# Patient Record
Sex: Female | Born: 1960
Health system: Southern US, Community
[De-identification: ages and names within clinical notes are randomized; demographics above are authoritative.]

## PROBLEM LIST (undated history)

## (undated) DIAGNOSIS — E079 Disorder of thyroid, unspecified: Secondary | ICD-10-CM

## (undated) DIAGNOSIS — J45909 Unspecified asthma, uncomplicated: Secondary | ICD-10-CM

## (undated) DIAGNOSIS — I10 Essential (primary) hypertension: Secondary | ICD-10-CM

## (undated) HISTORY — PX: ABDOMINAL HYSTERECTOMY: SHX81

## (undated) HISTORY — PX: THYROID SURGERY: SHX805

---

## 2011-06-26 DIAGNOSIS — E672 Megavitamin-B6 syndrome: Secondary | ICD-10-CM | POA: Insufficient documentation

## 2011-08-25 DIAGNOSIS — J309 Allergic rhinitis, unspecified: Secondary | ICD-10-CM | POA: Insufficient documentation

## 2013-06-29 ENCOUNTER — Encounter: Payer: Self-pay | Admitting: Emergency Medicine

## 2013-06-29 ENCOUNTER — Emergency Department
Admission: EM | Admit: 2013-06-29 | Discharge: 2013-06-29 | Disposition: A | Discharge status: Home / Self Care | Payer: 59 | Source: Home / Self Care | Attending: Family Medicine | Admitting: Family Medicine

## 2013-06-29 DIAGNOSIS — I951 Orthostatic hypotension: Secondary | ICD-10-CM

## 2013-06-29 DIAGNOSIS — J209 Acute bronchitis, unspecified: Secondary | ICD-10-CM

## 2013-06-29 DIAGNOSIS — J069 Acute upper respiratory infection, unspecified: Secondary | ICD-10-CM

## 2013-06-29 DIAGNOSIS — J45901 Unspecified asthma with (acute) exacerbation: Secondary | ICD-10-CM

## 2013-06-29 HISTORY — DX: Disorder of thyroid, unspecified: E07.9

## 2013-06-29 HISTORY — DX: Essential (primary) hypertension: I10

## 2013-06-29 HISTORY — DX: Unspecified asthma, uncomplicated: J45.909

## 2013-06-29 MED ORDER — AZITHROMYCIN 250 MG PO TABS: ORAL_TABLET | ORAL | Status: DC

## 2013-06-29 MED ORDER — METHYLPREDNISOLONE SODIUM SUCC 125 MG IJ SOLR
125.0000 mg | Freq: Once | INTRAMUSCULAR | Status: AC
  Administered 2013-06-29: 125 mg via INTRAMUSCULAR

## 2013-06-29 MED ORDER — PREDNISONE 50 MG PO TABS: ORAL_TABLET | ORAL | Status: DC

## 2013-06-29 MED ORDER — SODIUM CHLORIDE 0.9 % IV SOLN
Freq: Once | INTRAVENOUS | Status: AC
  Administered 2013-06-29: 1000 mL via INTRAVENOUS

## 2013-06-29 MED ORDER — BENZONATATE 100 MG PO CAPS: 100.0000 mg | ORAL_CAPSULE | Freq: Three times a day (TID) | ORAL | Status: DC | PRN

## 2013-06-29 NOTE — ED Notes (Signed)
Patient c/o body aches, sore throat, congestion , dry cough and chest tightness x 5 days. Patient has tried OTC Sudafed and Mucinex.

## 2013-06-29 NOTE — ED Provider Notes (Signed)
CSN: [id number]     Arrival date & time 06/29/13  1119 History   First MD Initiated Contact with Patient 06/29/13 1138     Chief Complaint  Patient presents with  . Cough  . Chest Pain    HPI URI Symptoms Onset: 4-5 days  Description: rhinorrhea, nasal congestion, cough, wheezing, chest tightness  Modifying factors:  Baseline hx/o asthma. Increased albuterol use.   Symptoms Nasal discharge: yes Fever: no Sore throat: no Cough: yes Wheezing: yes Ear pain: no GI symptoms: no Sick contacts: yes  Red Flags  Stiff neck: no Dyspnea: no Rash: no Swallowing difficulty: no  Sinusitis Risk Factors Headache/face pain: no Double sickening: no tooth pain: no  Allergy Risk Factors Sneezing: no Itchy scratchy throat: no Seasonal symptoms: no  Flu Risk Factors Headache: no muscle aches: no severe fatigue: no   No past medical history on file. No past surgical history on file. No family history on file. History  Substance Use Topics  . Smoking status: Not on file  . Smokeless tobacco: Not on file  . Alcohol Use: Not on file   OB History   No data available     Review of Systems  All other systems reviewed and are negative.    Allergies  Penicillins  Home Medications   Current Outpatient Rx  Name  Route  Sig  Dispense  Refill  . azithromycin (ZITHROMAX) 250 MG tablet      Take 2 tabs PO x 1 dose, then 1 tab PO QD x 4 days   6 tablet   0   . benzonatate (TESSALON) 100 MG capsule   Oral   Take 1-2 capsules (100-200 mg total) by mouth 3 (three) times daily as needed for cough.   40 capsule   0   . predniSONE (DELTASONE) 50 MG tablet      1 tab daily x 5 days   5 tablet   0    BP 114/78  Pulse 75  Temp(Src) 98.4 F (36.9 C) (Oral)  Resp 20  Ht 5\' 4"  (1.626 m)  Wt 135 lb 8 oz (61.462 kg)  BMI 23.25 kg/m2  SpO2 97% Physical Exam  Constitutional: She appears well-developed and well-nourished.  HENT:  Head: Normocephalic and atraumatic.   Eyes: Conjunctivae are normal. Pupils are equal, round, and reactive to light.  Neck: Normal range of motion. Neck supple.  Cardiovascular: Normal rate and regular rhythm.   Pulmonary/Chest: Effort normal. She has wheezes.  Abdominal: Soft. Bowel sounds are normal.  Musculoskeletal: Normal range of motion.  Neurological: She is alert.  Skin: Skin is warm.    ED Course  Procedures (including critical care time) Labs Review Labs Reviewed - No data to display Imaging Review No results found.  EKG Interpretation    Date/Time:    Ventricular Rate:    PR Interval:    QRS Duration:   QT Interval:    QTC Calculation:   R Axis:     Text Interpretation:              MDM   1. URI (upper respiratory infection)   2. Acute bronchitis   3. Asthma with acute exacerbation    URI induced asthma exacerbation.  Solumedrol 125mg  IM x1 Wil place on course of prednisone and zpak for resp coverage.  Tessalone perles for cough Discussed infectious and resp red flags at length.  Go to ER if sxs worsens.   Clinical update.  Pt reported nausea, weakness,  dizziness s/p solumedrol  Pt feels that it may from pain of shot. A lot of pain in L buttock VS obtained orthostatic BPs with SBPs in the low 100s-90s. Pt on HCTZ for BP.  EKG also obtained; NSR Pt given 1L NS bolus.  Symptomatically resolved s/p NS bolus. SBPs normalized to 110s. Likely vasovagal episode.  Pt left UC under her own power with no weakness, SOB.   Discussed CV, systemic and infectious red flags.      The patient and/or caregiver has been counseled thoroughly with regard to treatment plan and/or medications prescribed including dosage, schedule, interactions, rationale for use, and possible side effects and they verbalize understanding. Diagnoses and expected course of recovery discussed and will return if not improved as expected or if the condition worsens. Patient and/or caregiver verbalized understanding.             Shanda Howells, MD 06/29/13 1321

## 2014-02-16 ENCOUNTER — Emergency Department
Admission: EM | Admit: 2014-02-16 | Discharge: 2014-02-16 | Disposition: A | Discharge status: Home / Self Care | Payer: 59 | Source: Home / Self Care

## 2014-02-16 ENCOUNTER — Encounter: Payer: Self-pay | Admitting: Emergency Medicine

## 2014-02-16 ENCOUNTER — Emergency Department (INDEPENDENT_AMBULATORY_CARE_PROVIDER_SITE_OTHER): Payer: 59

## 2014-02-16 DIAGNOSIS — M25461 Effusion, right knee: Secondary | ICD-10-CM

## 2014-02-16 DIAGNOSIS — M25469 Effusion, unspecified knee: Secondary | ICD-10-CM

## 2014-02-16 MED ORDER — IBUPROFEN 800 MG PO TABS: 800.0000 mg | ORAL_TABLET | Freq: Three times a day (TID) | ORAL | Status: DC

## 2014-02-16 MED ORDER — HYDROCODONE-ACETAMINOPHEN 5-325 MG PO TABS: 2.0000 | ORAL_TABLET | ORAL | Status: DC | PRN

## 2014-02-16 NOTE — ED Notes (Signed)
Reports falling down steps 2 days ago; right knee abraded, stiff and edematous as well as painful.

## 2014-02-16 NOTE — Discharge Instructions (Signed)

## 2014-02-16 NOTE — ED Provider Notes (Signed)
CSN: [id number]     Arrival date & time 02/16/14  1945 History   None    No chief complaint on file.  (Consider location/radiation/quality/duration/timing/severity/associated sxs/prior Treatment) Patient is a 53 y.o. female presenting with knee pain. The history is provided by the patient. No language interpreter was used.  Knee Pain Location:  Knee Time since incident:  2 days Injury: yes   Knee location:  R knee Pain details:    Quality:  Aching   Radiates to:  Does not radiate   Severity:  Moderate   Onset quality:  Gradual   Duration:  2 days   Timing:  Constant   Progression:  Worsening Chronicity:  New Foreign body present:  No foreign bodies Tetanus status:  Up to date Prior injury to area:  No Relieved by:  Nothing Worsened by:  Nothing tried Ineffective treatments:  None tried Pt fell down steps and hit right knee.  Pt has soreness to knee and ankle  Past Medical History  Diagnosis Date  . Hypertension   . Thyroid disease   . Asthma    Past Surgical History  Procedure Laterality Date  . Cesarean section    . Abdominal hysterectomy    . Thyroid surgery     Family History  Problem Relation Age of Onset  . Hypertension Other   . Diabetes Other   . Lung cancer Other   . ALS Other    History  Substance Use Topics  . Smoking status: Former Research scientist (life sciences)  . Smokeless tobacco: Never Used  . Alcohol Use: No   OB History   Grav Para Term Preterm Abortions TAB SAB Ect Mult Living                 Review of Systems  All other systems reviewed and are negative.   Allergies  Penicillins  Home Medications   Prior to Admission medications   Medication Sig Start Date End Date Taking? Authorizing Provider  albuterol (PROVENTIL HFA;VENTOLIN HFA) 108 (90 BASE) MCG/ACT inhaler Inhale into the lungs every 6 (six) hours as needed for wheezing or shortness of breath.    Historical Provider, MD  azithromycin (ZITHROMAX) 250 MG tablet Take 2 tabs PO x 1 dose, then 1 tab  PO QD x 4 days 06/29/13   Shanda Howells, MD  benzonatate (TESSALON) 100 MG capsule Take 1-2 capsules (100-200 mg total) by mouth 3 (three) times daily as needed for cough. 06/29/13   Shanda Howells, MD  budesonide-formoterol [facility]) 160-4.5 MCG/ACT inhaler Inhale 2 puffs into the lungs 2 (two) times daily.    Historical Provider, MD  diclofenac (VOLTAREN) 75 MG EC tablet Take 75 mg by mouth 2 (two) times daily.    Historical Provider, MD  fexofenadine (ALLEGRA) 180 MG tablet Take 180 mg by mouth daily.    Historical Provider, MD  hydrochlorothiazide (HYDRODIURIL) 25 MG tablet Take 25 mg by mouth daily.    Historical Provider, MD  levothyroxine (SYNTHROID, LEVOTHROID) 125 MCG tablet Take 125 mcg by mouth daily before breakfast.    Historical Provider, MD  montelukast (SINGULAIR) 10 MG tablet Take 10 mg by mouth at bedtime.    Historical Provider, MD  predniSONE (DELTASONE) 50 MG tablet 1 tab daily x 5 days 06/29/13   Shanda Howells, MD   There were no vitals taken for this visit. Physical Exam  Vitals reviewed. Constitutional: She is oriented to person, place, and time. She appears well-developed and well-nourished.  HENT:  Head: Normocephalic and atraumatic.  Musculoskeletal: She exhibits tenderness.  Abrasion right knee,  Small bruise left knee, Right ankle abrasion  Neurological: She is alert and oriented to person, place, and time. She has normal reflexes.  Skin: There is erythema.  Psychiatric: She has a normal mood and affect.    ED Course  Procedures (including critical care time) Labs Review Labs Reviewed - No data to display  Imaging Review No results found.   MDM   1. Knee effusion, right   knee imbolizer Ibuprofen Hydrocodone Follow up with Dr. Darene Lamer for recheck in 1 week    [place], [place] 02/16/14 2042

## 2014-02-19 NOTE — ED Provider Notes (Signed)
Medical history/examination/treatment/procedure(s) were performed by non-physician provider and as supervising physician I was immediately available for consultation/collaboration.   Jacqulyn Cane, MD 02/19/14 1140

## 2014-02-24 ENCOUNTER — Ambulatory Visit (INDEPENDENT_AMBULATORY_CARE_PROVIDER_SITE_OTHER): Payer: 59 | Admitting: Sports Medicine

## 2014-02-24 ENCOUNTER — Encounter: Payer: Self-pay | Admitting: Sports Medicine

## 2014-02-24 VITALS — BP 144/82 | HR 62 | Ht 64.0 in | Wt 140.0 lb

## 2014-02-24 DIAGNOSIS — M1711 Unilateral primary osteoarthritis, right knee: Secondary | ICD-10-CM | POA: Insufficient documentation

## 2014-02-24 DIAGNOSIS — M1710 Unilateral primary osteoarthritis, unspecified knee: Secondary | ICD-10-CM

## 2014-02-24 NOTE — Progress Notes (Signed)
Patient ID: Kristina Kelley, female   DOB: July 07, 1960, 53 y.o.   MRN: [id number]   Subjective:    I'm seeing this patient as a consultation for: Jacqulyn Cane, MD  CC: Right knee pain  HPI: Kristina Kelley is a very pleasant 53 year old female with history of bilateral knee osteoarthritis who presents after a fall down two stairs 11 days ago (9/11). Reports that she fell straight onto her knees, right more than left, without twisting of the knee. She was seen in our Urgent Care on 9/14 after the knee became increasingly painful and swollen. X-ray imaging revealed mild degenerative changes with a moderate suprapatellar knee joint effusion without evidence of fracture or dislocation. She was discharged from our UC with a knee immobilizer, ibuprofen,and hydrocodone. Pain has been better controlled with these measures, and swelling has decreased significantly. She does report receiving both steroid injections and visco supplementation in the knee several years ago, with moderate relief after injections.  Past medical history, Surgical history, Family history not pertinant except as noted below, Social history, Allergies, and medications have been entered into the medical record, reviewed, and no changes needed.   Review of Systems: No body aches, joint swelling, muscle aches, chest pain or shortness of breath.  Objective:   General: Well developed, well nourished, and in no acute distress.  Neuro/Psych: Alert and oriented x3, extra-ocular muscles intact, able to move all 4 extremities, sensation grossly intact. Skin: Warm and dry, no rashes noted. Abrasion over right knee, left knee and right ankle. Slight bruising present on right knee. Respiratory: Not using accessory muscles, speaking in full sentences, trachea midline.  Cardiovascular: Pulses palpable, no extremity edema. Abdomen: Does not appear distended.  Right Knee: Inspection with no erythema or obvious bony abnormalities. Mild effusion  present. Palpation normal with no warmth or condyle tenderness. Tenderness to palpation over the medial joint line and at the areas of bruising at the tip of the patella. ROM full in extension but limited by pain in flexion. Non painful patellar compression. Patellar glide with crepitus. Patellar and quadriceps tendons unremarkable. Hamstring and quadriceps strength is normal.   Procedure: Real-time Ultrasound Guided Injection of right knee Device: GE Logiq E  Verbal informed consent obtained.  Time-out conducted.  Noted no overlying erythema, induration, or other signs of local infection.  Skin prepped in a sterile fashion.  Local anesthesia: Topical Ethyl chloride.  With sterile technique and under real time ultrasound guidance: 2 cc kenalog 40, 4 cc lidocaine injected easily into the suprapatellar recess.   Completed without difficulty  Pain immediately resolved suggesting accurate placement of the medication.  Advised to call if fevers/chills, erythema, induration, drainage, or persistent bleeding.  Images permanently stored and available for review in the ultrasound unit.  Impression: Technically successful ultrasound guided injection.  Impression and Recommendations:   This case required medical decision making of moderate complexity.  Right Knee Pain: This pain likely represents an exacerbation of her long-standing osteoarthritis after her recent fall. - Ultrasound-guided injection done in the office today - Continue Ibuprofen and Hydrocodone as needed for pain - Discontinue knee immobilizer - Return for follow-up in one month

## 2014-02-24 NOTE — Assessment & Plan Note (Signed)
With a recent flare after a fall. Injection as above. Discontinue knee immobilizer. Return in one month.

## 2014-03-26 ENCOUNTER — Encounter: Payer: Self-pay | Admitting: Sports Medicine

## 2014-03-26 ENCOUNTER — Ambulatory Visit (INDEPENDENT_AMBULATORY_CARE_PROVIDER_SITE_OTHER): Payer: 59 | Admitting: Sports Medicine

## 2014-03-26 VITALS — BP 126/80 | HR 62 | Ht 64.5 in | Wt 140.0 lb

## 2014-03-26 DIAGNOSIS — M1711 Unilateral primary osteoarthritis, right knee: Secondary | ICD-10-CM

## 2014-03-26 NOTE — Progress Notes (Signed)
  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 We injected her knee at the last visit and she is pain-free at rest, but still has a significant amount of pain when going down the stairs. Moderate, persistent. Localized under the kneecap without radiation.  Past medical history, Surgical history, Family history not pertinant except as noted below, Social history, Allergies, and medications have been entered into the medical record, reviewed, and no changes needed.   Review of Systems: No fevers, chills, night sweats, weight loss, chest pain, or shortness of breath.   Objective:    General: Well Developed, well nourished, and in no acute distress.  Neuro: Alert and oriented x3, extra-ocular muscles intact, sensation grossly intact.  HEENT: Normocephalic, atraumatic, pupils equal round reactive to light, neck supple, no masses, no lymphadenopathy, thyroid nonpalpable.  Skin: Warm and dry, no rashes. Cardiac: Regular rate and rhythm, no murmurs rubs or gallops, no lower extremity edema.  Respiratory: Clear to auscultation bilaterally. Not using accessory muscles, speaking in full sentences. Right Knee: Normal to inspection with no erythema or effusion or obvious bony abnormalities. Tender to palpation under the medial and lateral patellar facet with crepitus. ROM normal in flexion and extension and lower leg rotation. Ligaments with solid consistent endpoints including ACL, PCL, LCL, MCL. Negative Mcmurray's and provocative meniscal tests. Patellar and quadriceps tendons unremarkable. Hamstring and quadriceps strength is normal.  Impression and Recommendations:

## 2014-03-26 NOTE — Assessment & Plan Note (Signed)
Patellofemoral osteoarthritis. Improve significantly after steroid injection, pain-free at rest, but 7/10 going downstairs. Formal physical therapy, we will get Orthovisc approved and start her series in the right knee. If fails this she would be a candidate for mesenchymal stem cell implantation.

## 2014-03-31 ENCOUNTER — Telehealth: Payer: Self-pay | Admitting: *Deleted

## 2014-03-31 NOTE — Telephone Encounter (Signed)
LMOM for [place] to call re: UMR coverage for orthovisc. Benefits Investigation detailed that she has been uninsured with UMR since 09/02/13 and Shanon Brow is the only one covered. Awaiting phone call. Margette Fast, CMA

## 2014-03-31 NOTE — Telephone Encounter (Signed)
Dr. Darene Lamer - Just an [place]

## 2014-04-01 NOTE — Telephone Encounter (Signed)
Marti returned call and insurance was updated in computer. Margette Fast, RMA

## 2014-04-06 ENCOUNTER — Telehealth: Payer: Self-pay | Admitting: *Deleted

## 2014-04-06 ENCOUNTER — Ambulatory Visit (INDEPENDENT_AMBULATORY_CARE_PROVIDER_SITE_OTHER): Payer: 59 | Admitting: Physical Therapy

## 2014-04-06 DIAGNOSIS — M25569 Pain in unspecified knee: Secondary | ICD-10-CM

## 2014-04-06 DIAGNOSIS — M1711 Unilateral primary osteoarthritis, right knee: Secondary | ICD-10-CM

## 2014-04-06 DIAGNOSIS — M6281 Muscle weakness (generalized): Secondary | ICD-10-CM

## 2014-04-06 NOTE — Telephone Encounter (Signed)
Msg left on voicemail to return call scheduling Orthovisc injection. Waiting on return call. Margette Fast, RMA

## 2014-04-08 NOTE — Telephone Encounter (Signed)
Called mobile and left voice mail to call and schedule orthovisc now that it was approved. Waiting on return call. Margette Fast, CMA

## 2014-04-16 ENCOUNTER — Encounter (INDEPENDENT_AMBULATORY_CARE_PROVIDER_SITE_OTHER): Payer: 59 | Admitting: Physical Therapy

## 2014-04-16 DIAGNOSIS — M5416 Radiculopathy, lumbar region: Secondary | ICD-10-CM

## 2014-04-16 DIAGNOSIS — M2550 Pain in unspecified joint: Secondary | ICD-10-CM

## 2014-04-16 DIAGNOSIS — R5381 Other malaise: Secondary | ICD-10-CM

## 2014-04-16 DIAGNOSIS — R262 Difficulty in walking, not elsewhere classified: Secondary | ICD-10-CM

## 2014-04-23 ENCOUNTER — Encounter (INDEPENDENT_AMBULATORY_CARE_PROVIDER_SITE_OTHER): Payer: 59 | Admitting: Physical Therapy

## 2014-04-23 DIAGNOSIS — M1711 Unilateral primary osteoarthritis, right knee: Secondary | ICD-10-CM

## 2014-04-23 DIAGNOSIS — M25569 Pain in unspecified knee: Secondary | ICD-10-CM

## 2014-04-23 DIAGNOSIS — M6281 Muscle weakness (generalized): Secondary | ICD-10-CM

## 2014-04-27 ENCOUNTER — Encounter: Payer: Self-pay | Admitting: Sports Medicine

## 2014-04-27 ENCOUNTER — Ambulatory Visit (INDEPENDENT_AMBULATORY_CARE_PROVIDER_SITE_OTHER): Payer: 59 | Admitting: Sports Medicine

## 2014-04-27 VITALS — BP 123/70 | HR 62 | Wt 137.0 lb

## 2014-04-27 DIAGNOSIS — M1711 Unilateral primary osteoarthritis, right knee: Secondary | ICD-10-CM

## 2014-04-27 NOTE — Assessment & Plan Note (Signed)
Steroid and Orthovisc injection #1 today. Return in one week for #2 of 4

## 2014-04-27 NOTE — Progress Notes (Signed)
  Procedure: Real-time Ultrasound Guided Injection of right knee Device: GE Logiq E  Verbal informed consent obtained.  Time-out conducted.  Noted no overlying erythema, induration, or other signs of local infection.  Skin prepped in a sterile fashion.  Local anesthesia: Topical Ethyl chloride.  With sterile technique and under real time ultrasound guidance:  2 mL kenalog 40, 4 mL lidocaine injected easily, syringe switched and 30 mg/2 mL of OrthoVisc (sodium hyaluronate) in a prefilled syringe was injected easily into the knee through a 22-gauge needle. Completed without difficulty  Pain immediately resolved suggesting accurate placement of the medication.  Advised to call if fevers/chills, erythema, induration, drainage, or persistent bleeding.  Images permanently stored and available for review in the ultrasound unit.  Impression: Technically successful ultrasound guided injection.

## 2014-04-28 ENCOUNTER — Encounter: Payer: 59 | Admitting: Physical Therapy

## 2014-05-06 ENCOUNTER — Encounter: Payer: 59 | Admitting: Physical Therapy

## 2014-05-06 ENCOUNTER — Ambulatory Visit (INDEPENDENT_AMBULATORY_CARE_PROVIDER_SITE_OTHER): Payer: 59 | Admitting: Sports Medicine

## 2014-05-06 ENCOUNTER — Encounter: Payer: Self-pay | Admitting: Sports Medicine

## 2014-05-06 VITALS — BP 120/73 | HR 85 | Ht 64.0 in | Wt 140.0 lb

## 2014-05-06 DIAGNOSIS — M1711 Unilateral primary osteoarthritis, right knee: Secondary | ICD-10-CM

## 2014-05-06 NOTE — Assessment & Plan Note (Signed)
Orthovisc injection #2 of 4 into right knee. Return in 1 week for #3.

## 2014-05-06 NOTE — Progress Notes (Addendum)
   Procedure: Real-time Ultrasound Guided Injection of right knee Device: GE Logiq E  Ultrasound guided injection is preferred based studies that show increased duration, increased effect, greater accuracy, decreased procedural pain, increased response rate, and decreased cost with ultrasound guided versus blind injection.  Verbal informed consent obtained.  Time-out conducted.  Noted no overlying erythema, induration, or other signs of local infection.  Skin prepped in a sterile fashion.  Local anesthesia: Topical Ethyl chloride.  With sterile technique and under real time ultrasound guidance:  Aspirated 8 cc straw colored fluid, syringe switched and 30 mg/2 mL of OrthoVisc (sodium hyaluronate) in a prefilled syringe was injected easily into the knee through a 22-gauge needle. Completed without difficulty  Pain immediately resolved suggesting accurate placement of the medication.  Advised to call if fevers/chills, erythema, induration, drainage, or persistent bleeding.  Images permanently stored and available for review in the ultrasound unit.  Impression: Technically successful ultrasound guided injection.

## 2014-05-13 ENCOUNTER — Ambulatory Visit (INDEPENDENT_AMBULATORY_CARE_PROVIDER_SITE_OTHER): Payer: 59 | Admitting: Sports Medicine

## 2014-05-13 ENCOUNTER — Encounter: Payer: Self-pay | Admitting: Sports Medicine

## 2014-05-13 VITALS — BP 98/65 | HR 64 | Ht 64.5 in

## 2014-05-13 DIAGNOSIS — M1711 Unilateral primary osteoarthritis, right knee: Secondary | ICD-10-CM

## 2014-05-13 NOTE — Assessment & Plan Note (Signed)
Orthovisc injection #3 of 4 into the right knee. Return in one week for #4.

## 2014-05-13 NOTE — Progress Notes (Signed)
   Procedure: Real-time Ultrasound Guided Injection of right knee Device: GE Logiq E  Ultrasound guided injection is preferred based studies that show increased duration, increased effect, greater accuracy, decreased procedural pain, increased response rate, and decreased cost with ultrasound guided versus blind injection.  Verbal informed consent obtained.  Time-out conducted.  Noted no overlying erythema, induration, or other signs of local infection.  Skin prepped in a sterile fashion.  Local anesthesia: Topical Ethyl chloride.  With sterile technique and under real time ultrasound guidance:  Aspirated 4 cc straw colored fluid, syringe switched and 30 mg/2 mL of OrthoVisc (sodium hyaluronate) in a prefilled syringe was injected easily into the knee through a 22-gauge needle. Completed without difficulty  Pain immediately resolved suggesting accurate placement of the medication.  Advised to call if fevers/chills, erythema, induration, drainage, or persistent bleeding.  Images permanently stored and available for review in the ultrasound unit.  Impression: Technically successful ultrasound guided injection.

## 2014-05-21 ENCOUNTER — Encounter: Payer: Self-pay | Admitting: Sports Medicine

## 2014-05-21 ENCOUNTER — Ambulatory Visit (INDEPENDENT_AMBULATORY_CARE_PROVIDER_SITE_OTHER): Payer: 59 | Admitting: Sports Medicine

## 2014-05-21 DIAGNOSIS — M1711 Unilateral primary osteoarthritis, right knee: Secondary | ICD-10-CM

## 2014-05-21 NOTE — Progress Notes (Signed)
   Procedure: Real-time Ultrasound Guided Injection of right knee Device: GE Logiq E  Ultrasound guided injection is preferred based studies that show increased duration, increased effect, greater accuracy, decreased procedural pain, increased response rate, and decreased cost with ultrasound guided versus blind injection.  Verbal informed consent obtained.  Time-out conducted.  Noted no overlying erythema, induration, or other signs of local infection.  Skin prepped in a sterile fashion.  Local anesthesia: Topical Ethyl chloride.  With sterile technique and under real time ultrasound guidance:  30 mg/2 mL of OrthoVisc (sodium hyaluronate) in a prefilled syringe was injected easily into the knee through a 22-gauge needle. Completed without difficulty  Pain immediately resolved suggesting accurate placement of the medication.  Advised to call if fevers/chills, erythema, induration, drainage, or persistent bleeding.  Images permanently stored and available for review in the ultrasound unit.  Impression: Technically successful ultrasound guided injection.

## 2014-05-21 NOTE — Assessment & Plan Note (Signed)
Orthovisc injection #4 of 4 given today into the right knee. 50% improved. Switching from Voltaren to meloxicam which she already has at home. Return as needed.

## 2015-04-23 ENCOUNTER — Other Ambulatory Visit: Payer: Self-pay | Admitting: Family Medicine

## 2015-04-23 DIAGNOSIS — Z139 Encounter for screening, unspecified: Secondary | ICD-10-CM

## 2015-05-06 ENCOUNTER — Ambulatory Visit (INDEPENDENT_AMBULATORY_CARE_PROVIDER_SITE_OTHER): Payer: 59

## 2015-05-06 DIAGNOSIS — Z139 Encounter for screening, unspecified: Secondary | ICD-10-CM

## 2015-05-06 DIAGNOSIS — Z1231 Encounter for screening mammogram for malignant neoplasm of breast: Secondary | ICD-10-CM

## 2015-07-19 MED FILL — SYMBICORT 160-4.5 MCG INH: 160-4.5 | 30 days supply | Qty: 10 | Fill #3

## 2015-07-19 MED FILL — ESTRADIOL 0.1 MG PATCH: 0.1 | 84 days supply | Qty: 24 | Fill #2

## 2015-07-28 MED FILL — LEVOTHYROXINE 125 MCG TAB: 125 | 90 days supply | Qty: 90 | Fill #0

## 2015-07-28 MED FILL — HYDROCHLOROTHIAZIDE 25 MG T: 25 | 90 days supply | Qty: 90 | Fill #0

## 2015-08-03 DIAGNOSIS — M79675 Pain in left toe(s): Secondary | ICD-10-CM | POA: Diagnosis not present

## 2015-08-03 DIAGNOSIS — E039 Hypothyroidism, unspecified: Secondary | ICD-10-CM | POA: Insufficient documentation

## 2015-08-03 DIAGNOSIS — C4491 Basal cell carcinoma of skin, unspecified: Secondary | ICD-10-CM | POA: Insufficient documentation

## 2015-08-03 MED FILL — MELOXICAM 15 MG TABLET: 15 | 30 days supply | Qty: 30 | Fill #0

## 2015-09-15 MED FILL — SYMBICORT 160-4.5 MCG INH: 160-4.5 | 30 days supply | Qty: 10 | Fill #4

## 2015-10-08 MED FILL — ESTRADIOL 0.1 MG PATCH: 0.1 | 84 days supply | Qty: 24 | Fill #3

## 2015-10-12 ENCOUNTER — Ambulatory Visit: Payer: 59 | Admitting: Podiatry

## 2015-10-19 ENCOUNTER — Ambulatory Visit (INDEPENDENT_AMBULATORY_CARE_PROVIDER_SITE_OTHER): Payer: 59 | Admitting: Podiatry

## 2015-10-19 ENCOUNTER — Ambulatory Visit (INDEPENDENT_AMBULATORY_CARE_PROVIDER_SITE_OTHER): Payer: 59

## 2015-10-19 ENCOUNTER — Telehealth: Payer: Self-pay | Admitting: *Deleted

## 2015-10-19 ENCOUNTER — Other Ambulatory Visit: Payer: Self-pay | Admitting: Podiatry

## 2015-10-19 ENCOUNTER — Encounter: Payer: Self-pay | Admitting: Podiatry

## 2015-10-19 VITALS — BP 141/90 | HR 62 | Resp 12

## 2015-10-19 DIAGNOSIS — M79672 Pain in left foot: Secondary | ICD-10-CM | POA: Diagnosis not present

## 2015-10-19 DIAGNOSIS — M76812 Anterior tibial syndrome, left leg: Secondary | ICD-10-CM | POA: Diagnosis not present

## 2015-10-19 NOTE — Telephone Encounter (Addendum)
MRI orders to [facility] [employer]. 10/22/2015-[facility] DOES NOT REQUIRE PRE-CERT FOR [id number], REFERENCE # [id number] KATR. Faxed to [facility].  10/28/2015-DrMilinda Pointer requested Over Read of MRI left foot.  Mailed to [employer]. Informed pt of the delay.  11/04/2015-Left message informing pt Dr. Milinda Pointer had reviewed MRI overread and request pt make an appt to discuss.

## 2015-10-19 NOTE — Progress Notes (Signed)
   Subjective:    Patient ID: Kristina Kelley, female    DOB: 1961/02/01, 55 y.o.   MRN: [id number]  HPI: She presents today with a chief complaint of a large knot to the dorsal medial aspect of her left foot that has been very painful over the past few weeks to months. She states that the knot is been there for years and generally swells but today looks much better. She states it hurts right ear she points right behind the nodule and that she rubs the tibialis anterior tendon. She states that she gets some pain that radiates up her leg but very rarely out the toe. She's tried no over-the-counter treatment and has tried nothing to help alleviate symptoms.    Review of Systems  HENT: Positive for sinus pressure.   Cardiovascular: Positive for leg swelling.  Musculoskeletal: Positive for myalgias.       Objective:   Physical Exam: Vital signs are stable she is alert and oriented 3 she presents very pleasant distress. Pulses are strongly palpable bilateral. Neurologic sensorium is intact per Semmes-Weinstein monofilament deep tendon reflexes are intact muscle strength +5 over 5 dorsiflexion plantar flexors and inverters everters she does have pain on dorsiflexion and inversion against resistance from the tibialis anterior of the left foot. There is tenderness on palpation of this tendon. She has a very large nonpulsatile mass to the dorsal aspect of the first metatarsal medial cuneiform joint. Radiographs confirm what appears to be joint space narrowing in this area on 2 views. I see no signs of her fracture. The contralateral foot does also demonstrate a very small nonpulsatile nodular mass in the same area. 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 Cutaneous evaluation demonstrates supple well-hydrated cutis no erythema edema saline as drainage or odor. No open lesions or wounds.        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 Large osseous nonpulsatile nodule most likely associated with genetic predisposition but possibly osteoarthritic changes left foot.  Plan: Secondary to pain of the insertion site of the tibialis anterior an MRI is necessary to assess for a tear of the tendon and possible surgical intervention.

## 2015-10-25 ENCOUNTER — Telehealth: Payer: Self-pay | Admitting: *Deleted

## 2015-10-25 NOTE — Telephone Encounter (Signed)
"  I'm calling about your on-line precertification request for code [phone].  Upon checking her plan pre-cert is not required for MRI.  So there's no need to start a case.  Reference is my name and today's date, LethaM.  For other questions, call us."

## 2015-10-27 ENCOUNTER — Ambulatory Visit
Admission: RE | Admit: 2015-10-27 | Discharge: 2015-10-27 | Disposition: A | Discharge status: Home / Self Care | Payer: 59 | Source: Ambulatory Visit | Attending: Podiatry | Admitting: Podiatry

## 2015-10-27 DIAGNOSIS — M79672 Pain in left foot: Secondary | ICD-10-CM | POA: Diagnosis not present

## 2015-10-27 DIAGNOSIS — M76812 Anterior tibial syndrome, left leg: Secondary | ICD-10-CM

## 2015-10-28 NOTE — Telephone Encounter (Signed)
-----   Message from Garrel Ridgel, [place] sent at 10/27/2015  4:36 PM EDT ----- I need another over read please and inform patient of the delay and please inform SERORS of the area in question.  Thanks.

## 2015-11-15 MED FILL — SYMBICORT 160-4.5 MCG INH: 160-4.5 | 30 days supply | Qty: 10 | Fill #5

## 2015-11-16 ENCOUNTER — Ambulatory Visit (INDEPENDENT_AMBULATORY_CARE_PROVIDER_SITE_OTHER): Payer: 59 | Admitting: Podiatry

## 2015-11-16 ENCOUNTER — Encounter: Payer: Self-pay | Admitting: Podiatry

## 2015-11-16 VITALS — BP 115/78 | HR 62 | Resp 16

## 2015-11-16 DIAGNOSIS — M76812 Anterior tibial syndrome, left leg: Secondary | ICD-10-CM | POA: Diagnosis not present

## 2015-11-16 NOTE — Progress Notes (Signed)
She presents today for follow-up of her tibialis anterior tendon pain. And her MRI read as comeback.  Objective: She states that she is still in pain. She states that he has not been as painful lately because of the shoe gear. Vital signs are stable alert and oriented 3 she still has pain on palpation of the distal most aspect tibialis anterior as it courses beneath the medial aspect of the first metatarsal medial cuneiform joint. MRI does not demonstrates any type of osseus or tendinous abnormalities in this area.  Assessment: Tibialis anterior tendinitis.  Plan: I injected her today with dexamethasone and local anesthetic a total of 2 mg was injected just beneath the insertion site of the tibialis anterior tendon. I also referred her to physical therapy.

## 2015-11-17 MED FILL — HYDROCHLOROTHIAZIDE 25 MG T: 25 | 90 days supply | Qty: 90 | Fill #0

## 2015-11-17 MED FILL — LEVOTHYROXINE 125 MCG TAB: 125 | 90 days supply | Qty: 90 | Fill #0

## 2015-11-23 DIAGNOSIS — M6281 Muscle weakness (generalized): Secondary | ICD-10-CM | POA: Diagnosis not present

## 2015-11-23 DIAGNOSIS — M25572 Pain in left ankle and joints of left foot: Secondary | ICD-10-CM | POA: Diagnosis not present

## 2015-11-23 DIAGNOSIS — R262 Difficulty in walking, not elsewhere classified: Secondary | ICD-10-CM | POA: Diagnosis not present

## 2015-12-01 DIAGNOSIS — M6281 Muscle weakness (generalized): Secondary | ICD-10-CM | POA: Diagnosis not present

## 2015-12-01 DIAGNOSIS — R262 Difficulty in walking, not elsewhere classified: Secondary | ICD-10-CM | POA: Diagnosis not present

## 2015-12-01 DIAGNOSIS — M25572 Pain in left ankle and joints of left foot: Secondary | ICD-10-CM | POA: Diagnosis not present

## 2015-12-06 ENCOUNTER — Encounter: Payer: Self-pay | Admitting: Podiatry

## 2015-12-15 DIAGNOSIS — M25572 Pain in left ankle and joints of left foot: Secondary | ICD-10-CM | POA: Diagnosis not present

## 2015-12-15 DIAGNOSIS — R262 Difficulty in walking, not elsewhere classified: Secondary | ICD-10-CM | POA: Diagnosis not present

## 2015-12-15 DIAGNOSIS — M6281 Muscle weakness (generalized): Secondary | ICD-10-CM | POA: Diagnosis not present

## 2015-12-21 DIAGNOSIS — M6281 Muscle weakness (generalized): Secondary | ICD-10-CM | POA: Diagnosis not present

## 2015-12-21 DIAGNOSIS — R2689 Other abnormalities of gait and mobility: Secondary | ICD-10-CM | POA: Diagnosis not present

## 2015-12-21 DIAGNOSIS — M25572 Pain in left ankle and joints of left foot: Secondary | ICD-10-CM | POA: Diagnosis not present

## 2015-12-22 DIAGNOSIS — H16042 Marginal corneal ulcer, left eye: Secondary | ICD-10-CM | POA: Diagnosis not present

## 2015-12-22 MED FILL — GATIFLOXACIN 0.5% EYE DROPS: 0.5 | 10 days supply | Qty: 3 | Fill #0

## 2015-12-23 MED FILL — ESTRADIOL 0.1 MG PATCH: 0.1 | 84 days supply | Qty: 24 | Fill #4

## 2015-12-23 MED FILL — SYMBICORT 160-4.5 MCG INH: 160-4.5 | 30 days supply | Qty: 10 | Fill #6

## 2015-12-29 DIAGNOSIS — R2689 Other abnormalities of gait and mobility: Secondary | ICD-10-CM | POA: Diagnosis not present

## 2015-12-29 DIAGNOSIS — M25572 Pain in left ankle and joints of left foot: Secondary | ICD-10-CM | POA: Diagnosis not present

## 2015-12-29 DIAGNOSIS — M6281 Muscle weakness (generalized): Secondary | ICD-10-CM | POA: Diagnosis not present

## 2015-12-29 MED FILL — MONTELUKAST SOD 10 MG TAB: 10 | 30 days supply | Qty: 30 | Fill #0

## 2015-12-29 MED FILL — VENTOLIN HFA 90 MCG INHALER: 108 (90 BAS | 30 days supply | Qty: 18 | Fill #0

## 2016-01-26 DIAGNOSIS — M25562 Pain in left knee: Secondary | ICD-10-CM | POA: Diagnosis not present

## 2016-01-26 DIAGNOSIS — M6281 Muscle weakness (generalized): Secondary | ICD-10-CM | POA: Diagnosis not present

## 2016-01-26 DIAGNOSIS — R2689 Other abnormalities of gait and mobility: Secondary | ICD-10-CM | POA: Diagnosis not present

## 2016-01-26 DIAGNOSIS — M25561 Pain in right knee: Secondary | ICD-10-CM | POA: Diagnosis not present

## 2016-01-26 DIAGNOSIS — M25572 Pain in left ankle and joints of left foot: Secondary | ICD-10-CM | POA: Diagnosis not present

## 2016-01-26 DIAGNOSIS — R262 Difficulty in walking, not elsewhere classified: Secondary | ICD-10-CM | POA: Diagnosis not present

## 2016-02-01 MED FILL — LEVOTHYROXINE 125 MCG TAB: 125 | 90 days supply | Qty: 90 | Fill #0 | Status: TO

## 2016-02-01 MED FILL — HYDROCHLOROTHIAZIDE 25 MG T: 25 | 90 days supply | Qty: 90 | Fill #0 | Status: TO

## 2016-02-02 DIAGNOSIS — R262 Difficulty in walking, not elsewhere classified: Secondary | ICD-10-CM | POA: Diagnosis not present

## 2016-02-02 DIAGNOSIS — M25572 Pain in left ankle and joints of left foot: Secondary | ICD-10-CM | POA: Diagnosis not present

## 2016-02-02 DIAGNOSIS — M6281 Muscle weakness (generalized): Secondary | ICD-10-CM | POA: Diagnosis not present

## 2016-03-23 MED FILL — ESTRADIOL 0.1 MG PATCH: 0.1 | 84 days supply | Qty: 24 | Fill #0

## 2016-03-23 MED FILL — SYMBICORT 160-4.5 MCG INH: 160-4.5 | 30 days supply | Qty: 10 | Fill #0

## 2016-03-24 DIAGNOSIS — Z87891 Personal history of nicotine dependence: Secondary | ICD-10-CM | POA: Diagnosis not present

## 2016-03-24 DIAGNOSIS — E039 Hypothyroidism, unspecified: Secondary | ICD-10-CM | POA: Diagnosis not present

## 2016-03-24 DIAGNOSIS — Z23 Encounter for immunization: Secondary | ICD-10-CM | POA: Diagnosis not present

## 2016-03-24 DIAGNOSIS — J4540 Moderate persistent asthma, uncomplicated: Secondary | ICD-10-CM | POA: Diagnosis not present

## 2016-03-24 DIAGNOSIS — J301 Allergic rhinitis due to pollen: Secondary | ICD-10-CM | POA: Diagnosis not present

## 2016-03-24 DIAGNOSIS — Z79899 Other long term (current) drug therapy: Secondary | ICD-10-CM | POA: Diagnosis not present

## 2016-03-24 DIAGNOSIS — Z7951 Long term (current) use of inhaled steroids: Secondary | ICD-10-CM | POA: Diagnosis not present

## 2016-03-24 DIAGNOSIS — I10 Essential (primary) hypertension: Secondary | ICD-10-CM | POA: Diagnosis not present

## 2016-03-24 MED FILL — FLUCONAZOLE 100 MG TABLET: 100 | 7 days supply | Qty: 7 | Fill #0

## 2016-03-31 DIAGNOSIS — Z01419 Encounter for gynecological examination (general) (routine) without abnormal findings: Secondary | ICD-10-CM | POA: Diagnosis not present

## 2016-04-10 DIAGNOSIS — H527 Unspecified disorder of refraction: Secondary | ICD-10-CM | POA: Diagnosis not present

## 2016-05-05 ENCOUNTER — Other Ambulatory Visit: Payer: Self-pay | Admitting: Obstetrics and Gynecology

## 2016-05-05 DIAGNOSIS — Z1231 Encounter for screening mammogram for malignant neoplasm of breast: Secondary | ICD-10-CM

## 2016-05-12 MED FILL — HYDROCHLOROTHIAZIDE 25 MG T: 25 | 90 days supply | Qty: 90 | Fill #0

## 2016-05-12 MED FILL — LEVOTHYROXINE 125 MCG TAB: 125 | 90 days supply | Qty: 90 | Fill #0

## 2016-05-16 ENCOUNTER — Ambulatory Visit (INDEPENDENT_AMBULATORY_CARE_PROVIDER_SITE_OTHER): Payer: 59

## 2016-05-16 DIAGNOSIS — Z1231 Encounter for screening mammogram for malignant neoplasm of breast: Secondary | ICD-10-CM | POA: Diagnosis not present

## 2016-05-22 MED FILL — SYMBICORT 160-4.5 MCG INH: 160-4.5 | 30 days supply | Qty: 10 | Fill #1

## 2016-05-23 MED FILL — ESTRADIOL 0.1 MG PATCH: 0.1 | 84 days supply | Qty: 24 | Fill #0

## 2016-07-25 MED FILL — SYMBICORT 160-4.5 MCG INH: 160-4.5 | 30 days supply | Qty: 10 | Fill #2

## 2016-08-09 MED FILL — HYDROCHLOROTHIAZIDE 25 MG T: 25 | 90 days supply | Qty: 90 | Fill #0

## 2016-08-09 MED FILL — LEVOTHYROXINE 125 MCG TAB: 125 | 90 days supply | Qty: 90 | Fill #0

## 2016-08-24 ENCOUNTER — Other Ambulatory Visit: Payer: Self-pay | Admitting: Family Medicine

## 2016-08-24 ENCOUNTER — Ambulatory Visit (INDEPENDENT_AMBULATORY_CARE_PROVIDER_SITE_OTHER): Payer: 59

## 2016-08-24 DIAGNOSIS — M79641 Pain in right hand: Secondary | ICD-10-CM | POA: Diagnosis not present

## 2016-08-24 DIAGNOSIS — M19042 Primary osteoarthritis, left hand: Secondary | ICD-10-CM

## 2016-08-24 DIAGNOSIS — Z0000 Encounter for general adult medical examination without abnormal findings: Secondary | ICD-10-CM | POA: Diagnosis not present

## 2016-08-24 DIAGNOSIS — M19041 Primary osteoarthritis, right hand: Secondary | ICD-10-CM | POA: Diagnosis not present

## 2016-08-24 DIAGNOSIS — M659 Synovitis and tenosynovitis, unspecified: Secondary | ICD-10-CM | POA: Diagnosis not present

## 2016-08-24 DIAGNOSIS — M7989 Other specified soft tissue disorders: Secondary | ICD-10-CM

## 2016-08-24 DIAGNOSIS — M79642 Pain in left hand: Secondary | ICD-10-CM | POA: Diagnosis not present

## 2016-08-24 MED FILL — ESTRADIOL 0.1 MG PATCH: 0.1 | 84 days supply | Qty: 24 | Fill #1

## 2016-08-31 MED FILL — DICLOFENAC SOD 75 MG TAB EC: 75 | 30 days supply | Qty: 60 | Fill #0

## 2016-09-08 DIAGNOSIS — J40 Bronchitis, not specified as acute or chronic: Secondary | ICD-10-CM | POA: Diagnosis not present

## 2016-09-08 DIAGNOSIS — J329 Chronic sinusitis, unspecified: Secondary | ICD-10-CM | POA: Diagnosis not present

## 2016-09-08 MED FILL — VIRTUSSIN AC LIQUID: 100-10 | 8 days supply | Qty: 118 | Fill #0

## 2016-09-08 MED FILL — CLARITHROMYCIN 500 MG TAB: 500 | 10 days supply | Qty: 20 | Fill #0

## 2016-09-08 MED FILL — FLUCONAZOLE 150 MG TABLET: 150 | 7 days supply | Qty: 2 | Fill #0

## 2016-09-11 MED FILL — MOXIFLOXACIN HCL 400 MG TAB: 400 | 7 days supply | Qty: 7 | Fill #0

## 2016-09-12 MED FILL — SYMBICORT 160-4.5 MCG INH: 160-4.5 | 30 days supply | Qty: 10 | Fill #3

## 2016-09-12 MED FILL — AZITHROMYCIN 250 MG TABLET: 250 | 5 days supply | Qty: 6 | Fill #0 | Status: TO

## 2016-11-08 MED FILL — DICLOFENAC SOD 75 MG TAB EC: 75 | 30 days supply | Qty: 60 | Fill #1

## 2016-11-09 MED FILL — LEVOTHYROXINE 125 MCG TAB: 125 | 30 days supply | Qty: 30 | Fill #0 | Status: TO

## 2016-11-09 MED FILL — HYDROCHLOROTHIAZIDE 25 MG T: 25 | 30 days supply | Qty: 30 | Fill #0 | Status: TO

## 2016-11-16 MED FILL — SYMBICORT 160-4.5 MCG INH: 160-4.5 | 30 days supply | Qty: 10 | Fill #4

## 2016-11-29 MED FILL — VIVELLE-DOT 0.1 MG PATCH: 0.1 | 28 days supply | Qty: 8 | Fill #2 | Status: TO

## 2017-01-08 MED FILL — SYMBICORT 160-4.5 MCG INH: 160-4.5 | 30 days supply | Qty: 10 | Fill #0 | Status: TO

## 2017-01-08 MED FILL — DICLOFENAC SODIUM 75 MG TAB: 75 | 30 days supply | Qty: 60 | Fill #2 | Status: TO

## 2017-04-16 DIAGNOSIS — H527 Unspecified disorder of refraction: Secondary | ICD-10-CM | POA: Diagnosis not present

## 2017-04-16 DIAGNOSIS — H25813 Combined forms of age-related cataract, bilateral: Secondary | ICD-10-CM | POA: Diagnosis not present

## 2017-05-07 ENCOUNTER — Other Ambulatory Visit: Payer: Self-pay | Admitting: Family Medicine

## 2017-05-07 DIAGNOSIS — Z1231 Encounter for screening mammogram for malignant neoplasm of breast: Secondary | ICD-10-CM

## 2017-05-17 ENCOUNTER — Other Ambulatory Visit: Payer: Self-pay | Admitting: *Deleted

## 2017-05-17 ENCOUNTER — Ambulatory Visit (INDEPENDENT_AMBULATORY_CARE_PROVIDER_SITE_OTHER): Payer: 59

## 2017-05-17 DIAGNOSIS — Z1231 Encounter for screening mammogram for malignant neoplasm of breast: Secondary | ICD-10-CM

## 2017-09-12 DIAGNOSIS — M5412 Radiculopathy, cervical region: Secondary | ICD-10-CM | POA: Insufficient documentation

## 2017-12-18 ENCOUNTER — Emergency Department (INDEPENDENT_AMBULATORY_CARE_PROVIDER_SITE_OTHER): Payer: 59

## 2017-12-18 ENCOUNTER — Other Ambulatory Visit: Payer: Self-pay

## 2017-12-18 ENCOUNTER — Encounter: Payer: Self-pay | Admitting: *Deleted

## 2017-12-18 ENCOUNTER — Emergency Department (INDEPENDENT_AMBULATORY_CARE_PROVIDER_SITE_OTHER)
Admission: EM | Admit: 2017-12-18 | Discharge: 2017-12-18 | Disposition: A | Discharge status: Home / Self Care | Payer: 59 | Source: Home / Self Care | Attending: Family Medicine | Admitting: Family Medicine

## 2017-12-18 DIAGNOSIS — S62639A Displaced fracture of distal phalanx of unspecified finger, initial encounter for closed fracture: Secondary | ICD-10-CM | POA: Diagnosis not present

## 2017-12-18 DIAGNOSIS — S62635A Displaced fracture of distal phalanx of left ring finger, initial encounter for closed fracture: Secondary | ICD-10-CM | POA: Diagnosis not present

## 2017-12-18 DIAGNOSIS — S60221A Contusion of right hand, initial encounter: Secondary | ICD-10-CM | POA: Diagnosis not present

## 2017-12-18 NOTE — Discharge Instructions (Addendum)
Wear ace wrap until swelling decreases.  Elevate hand.  Apply ice pack for 10 to 15 minutes, 3 to 4 times daily  Continue until pain and swelling decrease.   Buddy tape finger.  May take Tylenol at bedtime for pain.
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
## 2018-02-17 IMAGING — DX DG HAND COMPLETE 3+V*L*
3 series · 3 of 3 positions shown · non-contrast
Comparison: None.

CLINICAL DATA: Bilateral hand pain and swelling for the past 6
months. No known injury.

EXAM:
LEFT HAND - COMPLETE 3+ VIEW

[hand pa]
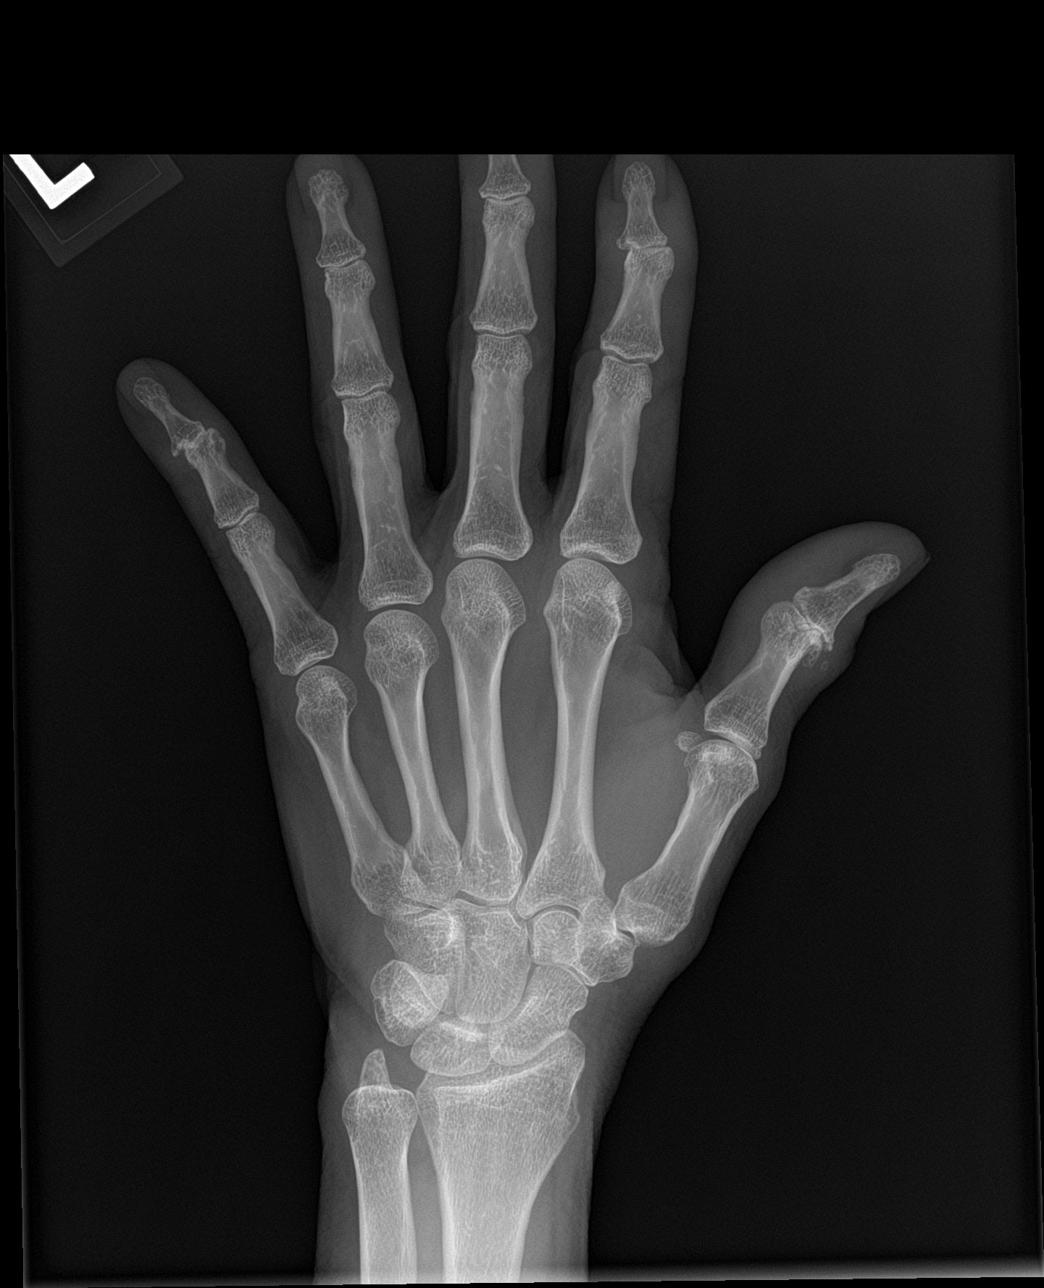

[hand obl]
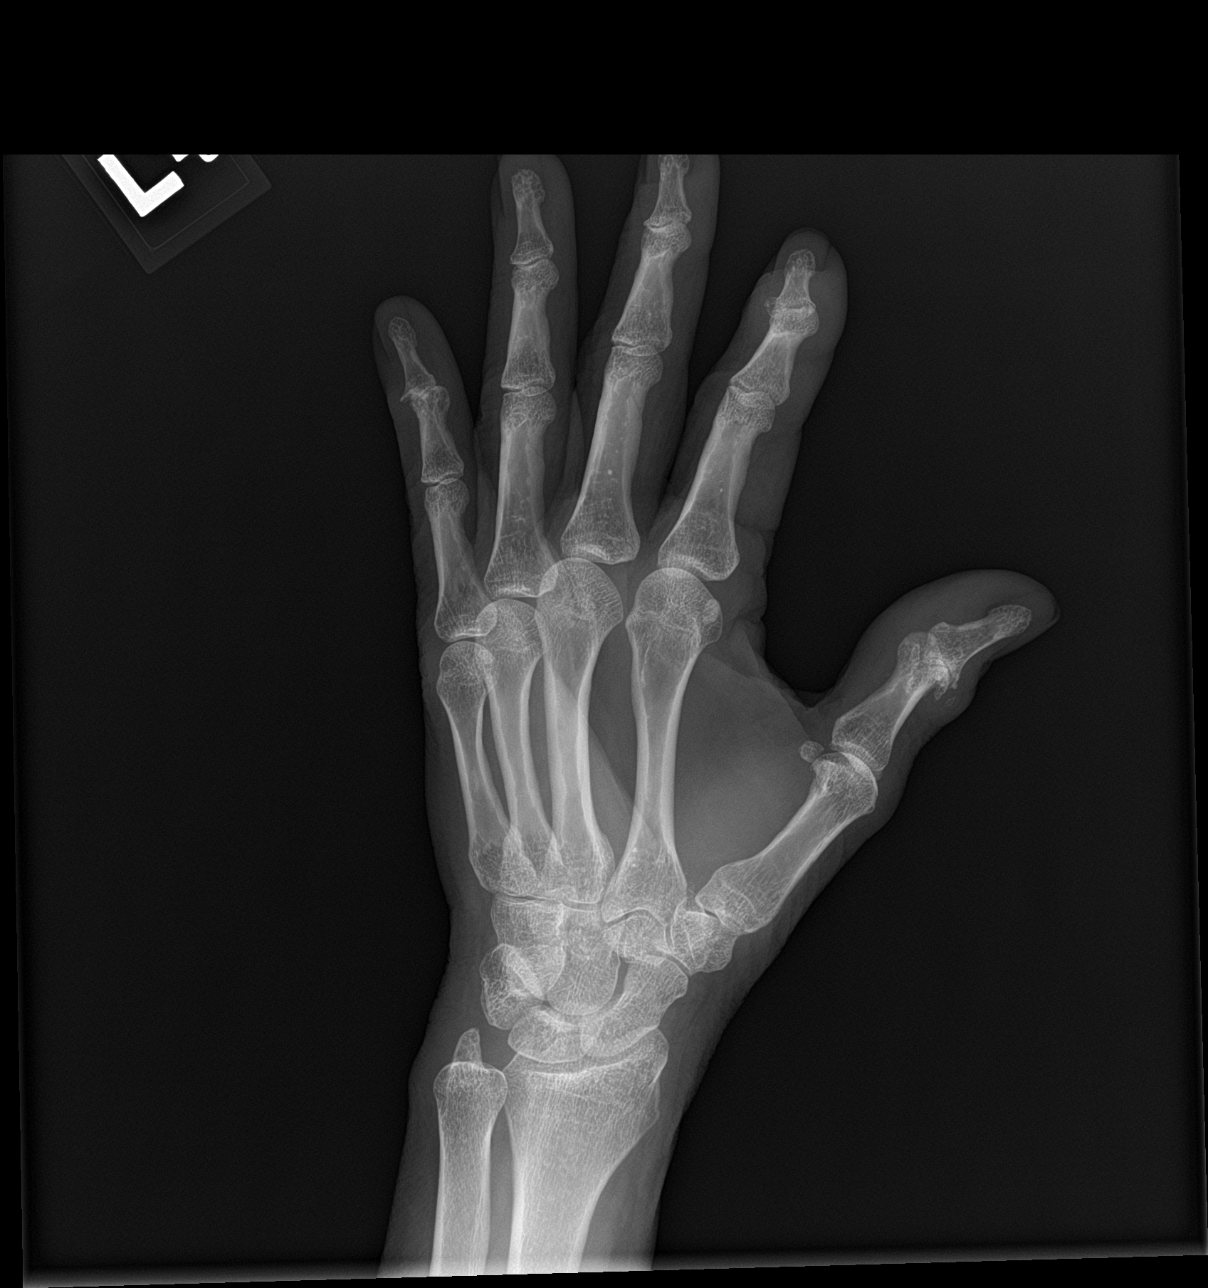

[hand lat]
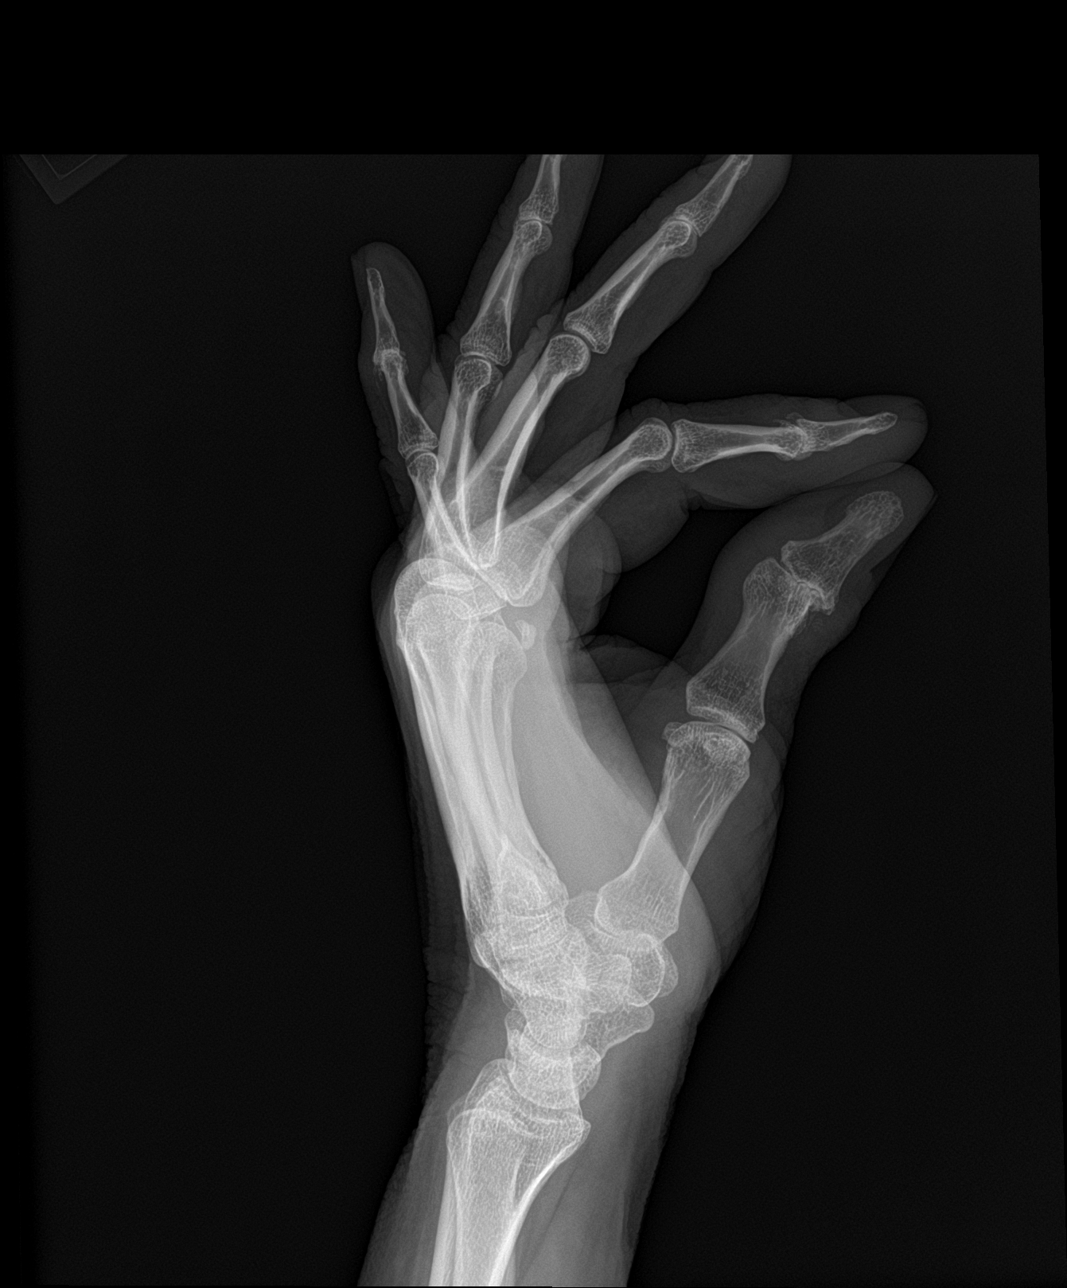

[3 of 3 positions shown; findings below may reference images not displayed]

FINDINGS: Marked first IP joint space narrowing and moderate spur formation
with a small amount of periarticular calcification and ossification.
Marked joint space narrowing and moderate spur formation involving
the second DIP joint. Similar changes involving the fifth DIP joint.
No bone erosions.
IMPRESSION: Changes of osteoarthritis, as described above.

## 2018-07-05 ENCOUNTER — Other Ambulatory Visit: Payer: Self-pay | Admitting: *Deleted

## 2018-07-05 DIAGNOSIS — Z0000 Encounter for general adult medical examination without abnormal findings: Secondary | ICD-10-CM

## 2018-07-11 ENCOUNTER — Ambulatory Visit (INDEPENDENT_AMBULATORY_CARE_PROVIDER_SITE_OTHER): Payer: 59

## 2018-07-11 DIAGNOSIS — Z0000 Encounter for general adult medical examination without abnormal findings: Secondary | ICD-10-CM

## 2018-07-11 DIAGNOSIS — Z1231 Encounter for screening mammogram for malignant neoplasm of breast: Secondary | ICD-10-CM
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
## 2019-07-14 ENCOUNTER — Other Ambulatory Visit: Payer: Self-pay | Admitting: *Deleted

## 2019-07-14 DIAGNOSIS — Z1231 Encounter for screening mammogram for malignant neoplasm of breast: Secondary | ICD-10-CM

## 2019-07-23 ENCOUNTER — Ambulatory Visit (INDEPENDENT_AMBULATORY_CARE_PROVIDER_SITE_OTHER): Payer: 59

## 2019-07-23 ENCOUNTER — Other Ambulatory Visit: Payer: Self-pay

## 2019-07-23 DIAGNOSIS — Z1231 Encounter for screening mammogram for malignant neoplasm of breast: Secondary | ICD-10-CM

## 2019-09-19 ENCOUNTER — Other Ambulatory Visit (HOSPITAL_BASED_OUTPATIENT_CLINIC_OR_DEPARTMENT_OTHER): Payer: Self-pay | Admitting: Family Medicine

## 2020-02-12 ENCOUNTER — Other Ambulatory Visit (HOSPITAL_BASED_OUTPATIENT_CLINIC_OR_DEPARTMENT_OTHER): Payer: Self-pay | Admitting: Pulmonary Disease

## 2020-02-12 DIAGNOSIS — J4540 Moderate persistent asthma, uncomplicated: Secondary | ICD-10-CM | POA: Diagnosis not present

## 2020-02-12 DIAGNOSIS — Z87891 Personal history of nicotine dependence: Secondary | ICD-10-CM | POA: Diagnosis not present

## 2020-02-12 DIAGNOSIS — Z7189 Other specified counseling: Secondary | ICD-10-CM | POA: Diagnosis not present

## 2020-02-12 DIAGNOSIS — J301 Allergic rhinitis due to pollen: Secondary | ICD-10-CM | POA: Diagnosis not present

## 2020-02-24 DIAGNOSIS — L578 Other skin changes due to chronic exposure to nonionizing radiation: Secondary | ICD-10-CM | POA: Diagnosis not present

## 2020-02-24 DIAGNOSIS — Z85820 Personal history of malignant melanoma of skin: Secondary | ICD-10-CM | POA: Diagnosis not present

## 2020-02-24 DIAGNOSIS — L821 Other seborrheic keratosis: Secondary | ICD-10-CM | POA: Diagnosis not present

## 2020-03-23 MED FILL — LEVOTHYROXINE SODIUM 112 MC: 112 | 60 days supply | Qty: 60 | Fill #0

## 2020-03-23 MED FILL — HYDROCHLOROTHIAZIDE 25 MG T: 25 | 60 days supply | Qty: 60 | Fill #0

## 2020-03-23 MED FILL — MONTELUKAST SOD 10 MG TAB: 10 | 90 days supply | Qty: 90 | Fill #0

## 2020-03-23 MED FILL — DICLOFENAC SODIUM 75 MG TAB: 75 | 30 days supply | Qty: 60 | Fill #0

## 2020-03-26 MED FILL — ESTRADIOL 0.1 MG PATCH: 0.1 | 56 days supply | Qty: 16 | Fill #0

## 2020-04-08 MED FILL — ESTRADIOL 0.1 MG PATCH: 0.1 | 56 days supply | Qty: 16 | Fill #0

## 2020-05-10 ENCOUNTER — Other Ambulatory Visit (HOSPITAL_BASED_OUTPATIENT_CLINIC_OR_DEPARTMENT_OTHER): Payer: Self-pay | Admitting: Family Medicine

## 2020-05-10 MED FILL — HYDROCHLOROTHIAZIDE 25 MG T: 25 | 60 days supply | Qty: 60 | Fill #0

## 2020-05-10 MED FILL — LEVOTHYROXINE SODIUM 112 MC: 112 | 60 days supply | Qty: 60 | Fill #0

## 2020-05-20 ENCOUNTER — Other Ambulatory Visit (HOSPITAL_BASED_OUTPATIENT_CLINIC_OR_DEPARTMENT_OTHER): Payer: Self-pay | Admitting: *Deleted

## 2020-05-24 MED FILL — ESTRADIOL 0.1 MG PATCH: 0.1 | 56 days supply | Qty: 16 | Fill #0

## 2020-06-08 DIAGNOSIS — D224 Melanocytic nevi of scalp and neck: Secondary | ICD-10-CM | POA: Diagnosis not present

## 2020-06-08 DIAGNOSIS — D1801 Hemangioma of skin and subcutaneous tissue: Secondary | ICD-10-CM | POA: Diagnosis not present

## 2020-06-08 DIAGNOSIS — Z85820 Personal history of malignant melanoma of skin: Secondary | ICD-10-CM | POA: Diagnosis not present

## 2020-06-08 DIAGNOSIS — C4359 Malignant melanoma of other part of trunk: Secondary | ICD-10-CM | POA: Insufficient documentation

## 2020-06-08 DIAGNOSIS — D2272 Melanocytic nevi of left lower limb, including hip: Secondary | ICD-10-CM | POA: Diagnosis not present

## 2020-06-08 DIAGNOSIS — L821 Other seborrheic keratosis: Secondary | ICD-10-CM | POA: Diagnosis not present

## 2020-07-13 ENCOUNTER — Other Ambulatory Visit: Payer: Self-pay | Admitting: *Deleted

## 2020-07-13 DIAGNOSIS — Z1231 Encounter for screening mammogram for malignant neoplasm of breast: Secondary | ICD-10-CM

## 2020-07-28 ENCOUNTER — Ambulatory Visit (INDEPENDENT_AMBULATORY_CARE_PROVIDER_SITE_OTHER): Payer: Managed Care, Other (non HMO)

## 2020-07-28 ENCOUNTER — Other Ambulatory Visit: Payer: Self-pay

## 2020-07-28 DIAGNOSIS — Z1231 Encounter for screening mammogram for malignant neoplasm of breast: Secondary | ICD-10-CM

## 2021-05-12 DIAGNOSIS — M1990 Unspecified osteoarthritis, unspecified site: Secondary | ICD-10-CM | POA: Diagnosis not present

## 2021-05-12 DIAGNOSIS — Z780 Asymptomatic menopausal state: Secondary | ICD-10-CM | POA: Diagnosis not present

## 2021-05-12 DIAGNOSIS — K5900 Constipation, unspecified: Secondary | ICD-10-CM | POA: Diagnosis not present

## 2021-05-12 DIAGNOSIS — R6889 Other general symptoms and signs: Secondary | ICD-10-CM | POA: Diagnosis not present

## 2021-05-12 DIAGNOSIS — I10 Essential (primary) hypertension: Secondary | ICD-10-CM | POA: Diagnosis not present

## 2021-05-12 DIAGNOSIS — Z1321 Encounter for screening for nutritional disorder: Secondary | ICD-10-CM | POA: Diagnosis not present

## 2021-05-12 DIAGNOSIS — R5383 Other fatigue: Secondary | ICD-10-CM | POA: Diagnosis not present

## 2021-05-12 DIAGNOSIS — E559 Vitamin D deficiency, unspecified: Secondary | ICD-10-CM | POA: Diagnosis not present

## 2021-05-12 DIAGNOSIS — E039 Hypothyroidism, unspecified: Secondary | ICD-10-CM | POA: Diagnosis not present

## 2021-06-29 ENCOUNTER — Other Ambulatory Visit: Payer: Self-pay | Admitting: *Deleted

## 2021-06-29 DIAGNOSIS — Z780 Asymptomatic menopausal state: Secondary | ICD-10-CM

## 2021-06-29 DIAGNOSIS — Z1231 Encounter for screening mammogram for malignant neoplasm of breast: Secondary | ICD-10-CM

## 2021-08-03 ENCOUNTER — Ambulatory Visit (INDEPENDENT_AMBULATORY_CARE_PROVIDER_SITE_OTHER): Payer: BC Managed Care – PPO

## 2021-08-03 ENCOUNTER — Other Ambulatory Visit: Payer: Managed Care, Other (non HMO)

## 2021-08-03 ENCOUNTER — Other Ambulatory Visit: Payer: Self-pay

## 2021-08-03 DIAGNOSIS — Z780 Asymptomatic menopausal state: Secondary | ICD-10-CM

## 2021-08-03 DIAGNOSIS — Z1231 Encounter for screening mammogram for malignant neoplasm of breast: Secondary | ICD-10-CM | POA: Diagnosis not present

## 2022-01-19 ENCOUNTER — Other Ambulatory Visit (HOSPITAL_BASED_OUTPATIENT_CLINIC_OR_DEPARTMENT_OTHER): Payer: Self-pay

## 2022-01-19 MED ORDER — ESTRADIOL 0.075 MG/24HR TD PTTW
MEDICATED_PATCH | TRANSDERMAL | 1 refills | Status: AC
  Filled 2022-01-19: qty 24, 84d supply, fill #0
  Filled 2022-04-10: qty 16, 56d supply, fill #1

## 2022-03-21 ENCOUNTER — Encounter: Payer: Self-pay | Admitting: Medical-Surgical

## 2022-03-21 ENCOUNTER — Ambulatory Visit: Payer: BC Managed Care – PPO | Admitting: Medical-Surgical

## 2022-03-21 VITALS — BP 127/73 | HR 98 | Resp 20 | Ht 64.0 in | Wt 136.0 lb

## 2022-03-21 DIAGNOSIS — E039 Hypothyroidism, unspecified: Secondary | ICD-10-CM | POA: Diagnosis not present

## 2022-03-21 DIAGNOSIS — K219 Gastro-esophageal reflux disease without esophagitis: Secondary | ICD-10-CM | POA: Insufficient documentation

## 2022-03-21 DIAGNOSIS — M159 Polyosteoarthritis, unspecified: Secondary | ICD-10-CM

## 2022-03-21 DIAGNOSIS — I10 Essential (primary) hypertension: Secondary | ICD-10-CM

## 2022-03-21 DIAGNOSIS — Z7689 Persons encountering health services in other specified circumstances: Secondary | ICD-10-CM | POA: Diagnosis not present

## 2022-03-21 DIAGNOSIS — J4540 Moderate persistent asthma, uncomplicated: Secondary | ICD-10-CM | POA: Diagnosis not present

## 2022-03-21 DIAGNOSIS — J45909 Unspecified asthma, uncomplicated: Secondary | ICD-10-CM | POA: Insufficient documentation

## 2022-03-21 NOTE — Progress Notes (Signed)
New Patient Office Visit  Subjective:  Patient ID: Kristina Kelley, female    DOB: 1961-02-18  Age: 61 y.o. MRN: [id number]  CC:  Chief Complaint  Patient presents with   Establish Care     HPI Kristina Kelley presents to establish care.   HTN: was on beta-blockers but when HCTZ started, was able to quit the beta-blocker. Has been taking HCTZ '25mg'$  daily, tolerating well without side effects. Previously checking her BP regularly but has not been checking as often lately.   Asthma: sees Pulmonology. Currently taking Allegra, Singulair, Symbicort, and Albuterol. Feels like symptoms are well managed. Usually worse with season changes (March, October) and with exercise.   Hypothyroidism: managed by [facility]. Taking Levothyroxine '100mg'$  and Liothyronine 42mg daily. S/p thyroidectomy 2006.  OA: previously tested for RA but so far negative. Affects bilateral hands, wrists, and knees. Also affecting the left foot. Used to take Voltaren BID, helpful but liver enzymes went up and she was told to back off on the Voltaren. [facility] started low dose naltrexone but it wasn't helpful. Has taken Meloxicam before but felt it made her gain weight. Has not tried Celebrex, Gabapentin, or Lyrica.   Past Medical History:  Diagnosis Date   Asthma    Hypertension    Thyroid disease    Past Surgical History:  Procedure Laterality Date   ABDOMINAL HYSTERECTOMY     CESAREAN SECTION     THYROID SURGERY     Family History  Problem Relation Age of Onset   Hypertension Other    Diabetes Other    Lung cancer Other    ALS Other    Breast cancer Sister [id number]  Social History   Socioeconomic History   Marital status: Married    Spouse name: Not on file   Number of children: Not on file   Years of education: Not on file   Highest education level: Not on file  Occupational History   Not on file  Tobacco Use   Smoking status: Former   Smokeless tobacco: Never  Vaping Use   Vaping  Use: Never used  Substance and Sexual Activity   Alcohol use: No   Drug use: No   Sexual activity: Not on file  Other Topics Concern   Not on file  Social History Narrative   Not on file   Social Determinants of Health   Financial Resource Strain: Not on file  Food Insecurity: Not on file  Transportation Needs: Not on file  Physical Activity: Not on file  Stress: Not on file  Social Connections: Not on file  Intimate Partner Violence: Not on file    ROS Review of Systems  Constitutional:  Negative for chills, fatigue, fever and unexpected weight change.  HENT:  Negative for congestion, rhinorrhea, sinus pressure and sore throat.   Respiratory:  Negative for cough, chest tightness and shortness of breath.   Cardiovascular:  Negative for chest pain, palpitations and leg swelling.  Gastrointestinal:  Negative for abdominal pain, constipation, diarrhea, nausea and vomiting.  Endocrine: Negative for cold intolerance and heat intolerance.  Genitourinary:  Negative for dysuria, frequency, urgency, vaginal bleeding and vaginal discharge.  Skin:  Negative for rash and wound.  Neurological:  Negative for dizziness, light-headedness and headaches.  Hematological:  Does not bruise/bleed easily.  Psychiatric/Behavioral:  Negative for self-injury, sleep disturbance and suicidal ideas. The patient is not nervous/anxious.     Objective:   Today's Vitals: BP 127/73   Pulse 98  Resp 20   Ht '5\' 4"'$  (1.626 m)   Wt 136 lb (61.7 kg)   SpO2 98%   BMI 23.34 kg/m   Physical Exam  Assessment & Plan:   1. Encounter to establish care Reviewed available information and discussed care concerns with patient.   2. Primary hypertension Checking labs as below.  Blood pressure at goal today.  Continue hydrochlorothiazide 25 mcg daily.  Low-sodium diet, regular intentional exercise, and maintenance of a healthy weight recommended. - CBC with Differential/Platelet - COMPLETE METABOLIC PANEL WITH  GFR - Lipid panel  3. Moderate persistent asthma without complication Well managed with Symbicort, albuterol, Allegra, and Singulair.  No change in plan.  Followed by pulmonology.  4. Osteoarthritis of multiple joints, unspecified osteoarthritis type Reviewed various options for the treatment of osteoarthritis.  Medication information provided on Lyrica, gabapentin, and Celebrex for patient review.  We will check her labs today and make sure her kidney and liver function are okay and once the results are back, we can start which ever medication she would like to try.  5. Hypothyroidism, unspecified type Managed by [facility].  She will likely look to transition away from them at some point.  Advised her that we will be able to manage her levothyroxine and liothyronine at our clinic if desired.   Outpatient Encounter Medications as of 03/21/2022  Medication Sig   albuterol (PROVENTIL HFA;VENTOLIN HFA) 108 (90 BASE) MCG/ACT inhaler Inhale into the lungs every 6 (six) hours as needed for wheezing or shortness of breath.   budesonide-formoterol (SYMBICORT) 160-4.5 MCG/ACT inhaler Inhale 2 puffs into the lungs 2 (two) times daily.   Cholecalciferol (D3 5000 PO) Take 5,000 Units by mouth daily.   cyanocobalamin (VITAMIN B12) 1000 MCG tablet Take 1,000 mcg by mouth daily.   estradiol (VIVELLE-DOT) 0.075 MG/24HR Apply 1 new patch twice weekly as directed.   fexofenadine (ALLEGRA) 180 MG tablet Take 180 mg by mouth daily.   hydrochlorothiazide (HYDRODIURIL) 25 MG tablet Take 25 mg by mouth daily.   levothyroxine (SYNTHROID) 100 MCG tablet Take 100 mcg by mouth every morning.   liothyronine (CYTOMEL) 5 MCG tablet Take 5 mcg by mouth every morning.   Lysine 500 MG CAPS Take 500 mg by mouth daily.   montelukast (SINGULAIR) 10 MG tablet Take 10 mg by mouth at bedtime.   Omega-3 Fatty Acids (FISH OIL PO) Take 800 mg by mouth daily.   [DISCONTINUED] estradiol (VIVELLE-DOT) 0.1 MG/24HR patch  PLACE 1 PATCH ONTO THE SKIN TWICE PER WEEK   [DISCONTINUED] hydrochlorothiazide (HYDRODIURIL) 25 MG tablet Take 25 mg by mouth daily.   [DISCONTINUED] hydrochlorothiazide (HYDRODIURIL) 25 MG tablet TAKE 1 TABLET BY MOUTH ONCE DAILY   [DISCONTINUED] levothyroxine (SYNTHROID) 112 MCG tablet TAKE 1 TABLET BY MOUTH ONCE DAILY   [DISCONTINUED] levothyroxine (SYNTHROID, LEVOTHROID) 125 MCG tablet Take 125 mcg by mouth daily before breakfast.   [DISCONTINUED] meloxicam (MOBIC) 15 MG tablet One tab PO qAM with breakfast for 2 weeks, then daily prn pain.   [DISCONTINUED] montelukast (SINGULAIR) 10 MG tablet Take 10 mg by mouth at bedtime.   [DISCONTINUED] montelukast (SINGULAIR) 10 MG tablet TAKE 1 TABLET BY MOUTH ONCE DAILY AT NIGHT   No facility-administered encounter medications on file as of 03/21/2022.    Follow-up: Return in about 6 weeks (around 05/02/2022).   Clearnce Sorrel, DNP, APRN, FNP-BC [facility]

## 2022-03-22 LAB — LIPID PANEL
Cholesterol: 158 mg/dL (ref <200)
HDL: 64 mg/dL (ref >=50)
LDL Cholesterol (Calc): 80 mg/dL (calc)
Non-HDL Cholesterol (Calc): 94 mg/dL (calc) (ref <130)
Total CHOL/HDL Ratio: 2.5 (calc) (ref <5.0)
Triglycerides: 60 mg/dL (ref <150)

## 2022-03-22 LAB — CBC WITH DIFFERENTIAL/PLATELET
Absolute Monocytes: 387 cells/uL (ref 200–950)
Basophils Absolute: 20 cells/uL (ref 0–200)
Basophils Relative: 0.4 %
Eosinophils Absolute: 78 cells/uL (ref 15–500)
Eosinophils Relative: 1.6 %
HCT: 41.6 % (ref 35.0–45.0)
Hemoglobin: 14.1 g/dL (ref 11.7–15.5)
Lymphs Abs: 1132 cells/uL (ref 850–3900)
MCH: 31.5 pg (ref 27.0–33.0)
MCHC: 33.9 g/dL (ref 32.0–36.0)
MCV: 93.1 fL (ref 80.0–100.0)
MPV: 10.9 fL (ref 7.5–12.5)
Monocytes Relative: 7.9 %
Neutro Abs: 3283 cells/uL (ref 1500–7800)
Neutrophils Relative %: 67 %
Platelets: 270 10*3/uL (ref 140–400)
RBC: 4.47 10*6/uL (ref 3.80–5.10)
RDW: 12.3 % (ref 11.0–15.0)
Total Lymphocyte: 23.1 %
WBC: 4.9 10*3/uL (ref 3.8–10.8)

## 2022-03-22 LAB — COMPLETE METABOLIC PANEL WITH GFR
AG Ratio: 1.8 (calc) (ref 1.0–2.5)
ALT: 21 U/L (ref 6–29)
AST: 20 U/L (ref 10–35)
Albumin: 4.5 g/dL (ref 3.6–5.1)
Alkaline phosphatase (APISO): 47 U/L (ref 37–153)
BUN: 17 mg/dL (ref 7–25)
CO2: 28 mmol/L (ref 20–32)
Calcium: 9.6 mg/dL (ref 8.6–10.4)
Chloride: 102 mmol/L (ref 98–110)
Creat: 0.76 mg/dL (ref 0.50–1.05)
Globulin: 2.5 g/dL (calc) (ref 1.9–3.7)
Glucose, Bld: 91 mg/dL (ref 65–99)
Potassium: 4 mmol/L (ref 3.5–5.3)
Sodium: 139 mmol/L (ref 135–146)
Total Bilirubin: 0.7 mg/dL (ref 0.2–1.2)
Total Protein: 7 g/dL (ref 6.1–8.1)
eGFR: 89 mL/min/{1.73_m2} (ref >=60)

## 2022-03-22 NOTE — Progress Notes (Signed)
LMVM advising the patient that the results are available on MyChart or the patient can contact the office.

## 2022-04-11 ENCOUNTER — Other Ambulatory Visit (HOSPITAL_BASED_OUTPATIENT_CLINIC_OR_DEPARTMENT_OTHER): Payer: Self-pay

## 2022-04-17 ENCOUNTER — Ambulatory Visit: Admit: 2022-04-17 | Discharge status: Absent without leave | Payer: Managed Care, Other (non HMO)

## 2022-04-17 ENCOUNTER — Ambulatory Visit
Admission: EM | Admit: 2022-04-17 | Discharge: 2022-04-17 | Disposition: A | Discharge status: Home / Self Care | Payer: BC Managed Care – PPO | Attending: Family Medicine | Admitting: Family Medicine

## 2022-04-17 DIAGNOSIS — U071 COVID-19: Secondary | ICD-10-CM | POA: Diagnosis not present

## 2022-04-17 DIAGNOSIS — R059 Cough, unspecified: Secondary | ICD-10-CM

## 2022-04-17 MED ORDER — BENZONATATE 200 MG PO CAPS: 200.0000 mg | ORAL_CAPSULE | Freq: Three times a day (TID) | ORAL | 0 refills | Status: AC | PRN

## 2022-04-17 MED ORDER — MOLNUPIRAVIR EUA 200MG CAPSULE: 4.0000 | ORAL_CAPSULE | Freq: Two times a day (BID) | ORAL | 0 refills | Status: AC

## 2022-04-17 NOTE — Discharge Instructions (Addendum)
Advised patient to take medications as directed with food to completion.  Advised may take Tessalon daily or as needed for cough.  Encouraged patient to increase daily water intake to 64 ounces per day while taking these medications.  Advised patient to remain self quarantined for the next 5 days.  Advised if symptoms worsen and/or unresolved please follow-up with PCP for further evaluation.

## 2022-04-17 NOTE — ED Triage Notes (Signed)
Pt presents with c/o fever, body aches, chills after testing positive for Covid this morning.

## 2022-04-17 NOTE — ED Provider Notes (Signed)
[facility]    CSN: [id number] Arrival date & time: 04/17/22  0911      History   Chief Complaint Chief Complaint  Patient presents with   covid 19    HPI Kristina Kelley is a 61 y.o. female.   HPI 61 year old female presents with COVID-19 testing positive at her house this morning.  Reports fever, body aches, and chills after testing positive this morning.  PMH significant for BCC, HTN, asthma and thyroid disease.  Past Medical History:  Diagnosis Date   Asthma    Hypertension    Thyroid disease     Patient Active Problem List   Diagnosis Date Noted   Asthma 03/21/2022   Gastro-esophageal reflux disease without esophagitis 03/21/2022   Hypertension 03/21/2022   Moderate persistent asthma 03/21/2022   Malignant melanoma of torso excluding breast ([place]) 06/08/2020   Cervical radiculopathy 09/12/2017   Basal cell carcinoma 08/03/2015   Hypothyroidism 08/03/2015   Osteoarthritis of right knee 02/24/2014   Allergic rhinitis 08/25/2011   Hypervitaminosis B6 06/26/2011    Past Surgical History:  Procedure Laterality Date   ABDOMINAL HYSTERECTOMY     CESAREAN SECTION     THYROID SURGERY      OB History   No obstetric history on file.      Home Medications    Prior to Admission medications   Medication Sig Start Date End Date Taking? Authorizing Provider  benzonatate (TESSALON) 200 MG capsule Take 1 capsule (200 mg total) by mouth 3 (three) times daily as needed for up to 7 days. 04/17/22 04/24/22 Yes Eliezer Lofts, FNP  molnupiravir EUA (LAGEVRIO) 200 mg CAPS capsule Take 4 capsules (800 mg total) by mouth 2 (two) times daily for 5 days. 04/17/22 04/22/22 Yes Eliezer Lofts, FNP  albuterol (PROVENTIL HFA;VENTOLIN HFA) 108 (90 BASE) MCG/ACT inhaler Inhale into the lungs every 6 (six) hours as needed for wheezing or shortness of breath.    [provider]  budesonide-formoterol (SYMBICORT) 160-4.5 MCG/ACT inhaler Inhale 2 puffs into the lungs 2  (two) times daily.    [provider]  Cholecalciferol (D3 5000 PO) Take 5,000 Units by mouth daily.    [provider]  cyanocobalamin (VITAMIN B12) 1000 MCG tablet Take 1,000 mcg by mouth daily.    [provider]  estradiol (VIVELLE-DOT) 0.075 MG/24HR Apply 1 new patch twice weekly as directed. 09/27/21     fexofenadine (ALLEGRA) 180 MG tablet Take 180 mg by mouth daily.    [provider]  hydrochlorothiazide (HYDRODIURIL) 25 MG tablet Take 25 mg by mouth daily.    [provider]  levothyroxine (SYNTHROID) 100 MCG tablet Take 100 mcg by mouth every morning. 12/28/20   [provider]  liothyronine (CYTOMEL) 5 MCG tablet Take 5 mcg by mouth every morning. 02/20/22   [provider]  Lysine 500 MG CAPS Take 500 mg by mouth daily.    [provider]  montelukast (SINGULAIR) 10 MG tablet Take 10 mg by mouth at bedtime. Patient not taking: Reported on 04/17/2022    [provider]  Omega-3 Fatty Acids (FISH OIL PO) Take 800 mg by mouth daily.    [provider]    Family History Family History  Problem Relation Age of Onset   Hypertension Other    Diabetes Other    Lung cancer Other    ALS Other    Breast cancer Sister 76    Social History Social History   Tobacco Use  Smoking status: Former   Smokeless tobacco: Never  Scientific laboratory technician Use: Never used  Substance Use Topics   Alcohol use: No   Drug use: No     Allergies   Penicillins   Review of Systems Review of Systems  Constitutional:        Reports testing positive for COVID-19 on  All other systems reviewed and are negative.    Physical Exam Triage Vital Signs ED Triage Vitals  Enc Vitals Group     BP      Pulse      Resp      Temp      Temp src      SpO2      Weight      Height      Head Circumference      Peak Flow      Pain Score      Pain Loc      Pain Edu?      Excl. in [place]?    No data  found.  Updated Vital Signs BP 111/62 (BP Location: Right Arm)   Pulse 85   Temp 99.8 F (37.7 C) (Oral)   Resp 14   SpO2 95%     Physical Exam Vitals and nursing note reviewed.  Constitutional:      Appearance: Normal appearance. She is normal weight. She is ill-appearing.  HENT:     Head: Normocephalic and atraumatic.     Right Ear: Tympanic membrane, ear canal and external ear normal.     Left Ear: Tympanic membrane, ear canal and external ear normal.     Mouth/Throat:     Mouth: Mucous membranes are moist.     Pharynx: Oropharynx is clear.  Eyes:     Extraocular Movements: Extraocular movements intact.     Conjunctiva/sclera: Conjunctivae normal.     Pupils: Pupils are equal, round, and reactive to light.  Cardiovascular:     Rate and Rhythm: Normal rate and regular rhythm.     Pulses: Normal pulses.     Heart sounds: Normal heart sounds.  Pulmonary:     Effort: Pulmonary effort is normal.     Breath sounds: Normal breath sounds. No wheezing, rhonchi or rales.     Comments: Infrequent nonproductive cough noted on exam Musculoskeletal:        General: Normal range of motion.     Cervical back: Normal range of motion and neck supple. Tenderness present.  Lymphadenopathy:     Cervical: Cervical adenopathy present.  Skin:    General: Skin is warm and dry.  Neurological:     General: No focal deficit present.     Mental Status: She is alert and oriented to person, place, and time.      UC Treatments / Results  Labs (all labs ordered are listed, but only abnormal results are displayed) Labs Reviewed - No data to display  EKG   Radiology No results found.  Procedures Procedures (including critical care time)  Medications Ordered in UC Medications - No data to display  Initial Impression / Assessment and Plan / UC Course  I have reviewed the triage vital signs and the nursing notes.  Pertinent labs & imaging results that were available during my care  of the patient were reviewed by me and considered in my medical decision making (see chart for details).     MDM: 1.  COVID-19-Rx'd Molnupiravir; 2.  Cough-Rx'd Tessalon. Advised patient to take medications  as directed with food to completion.  Advised may take Tessalon daily or as needed for cough.  Encouraged patient to increase daily water intake to 64 ounces per day while taking these medications.  Advised patient to remain self quarantined for the next 5 days.  Advised if symptoms worsen and/or unresolved please follow-up with PCP for further evaluation.  Work note provided to patient prior to discharge.  Patient discharged home, hemodynamically stable. Final Clinical Impressions(s) / UC Diagnoses   Final diagnoses:  COVID-19  Cough, unspecified type     Discharge Instructions      Advised patient to take medications as directed with food to completion.  Advised may take Tessalon daily or as needed for cough.  Encouraged patient to increase daily water intake to 64 ounces per day while taking these medications.  Advised patient to remain self quarantined for the next 5 days.  Advised if symptoms worsen and/or unresolved please follow-up with PCP for further evaluation.     ED Prescriptions     Medication Sig Dispense Auth. Provider   benzonatate (TESSALON) 200 MG capsule Take 1 capsule (200 mg total) by mouth 3 (three) times daily as needed for up to 7 days. 40 capsule Eliezer Lofts, FNP   molnupiravir EUA (LAGEVRIO) 200 mg CAPS capsule Take 4 capsules (800 mg total) by mouth 2 (two) times daily for 5 days. 40 capsule Eliezer Lofts, FNP      PDMP not reviewed this encounter.   Eliezer Lofts, [place] 04/17/22 1009

## 2022-04-18 ENCOUNTER — Telehealth: Payer: Self-pay | Admitting: Emergency Medicine

## 2022-04-18 NOTE — Telephone Encounter (Signed)
Patient not feeling any better, miserable. Advised her to continue meds, tylenol and advil, fluids and rest.

## 2022-04-21 ENCOUNTER — Encounter: Payer: Self-pay | Admitting: Medical-Surgical

## 2022-04-25 ENCOUNTER — Ambulatory Visit: Payer: BC Managed Care – PPO | Admitting: Family Medicine

## 2022-07-21 ENCOUNTER — Other Ambulatory Visit: Payer: Self-pay | Admitting: *Deleted

## 2022-07-21 DIAGNOSIS — Z1231 Encounter for screening mammogram for malignant neoplasm of breast: Secondary | ICD-10-CM

## 2022-08-23 ENCOUNTER — Ambulatory Visit: Payer: BC Managed Care – PPO

## 2022-09-27 ENCOUNTER — Ambulatory Visit (INDEPENDENT_AMBULATORY_CARE_PROVIDER_SITE_OTHER): Payer: BC Managed Care – PPO

## 2022-09-27 DIAGNOSIS — Z1231 Encounter for screening mammogram for malignant neoplasm of breast: Secondary | ICD-10-CM

## 2023-01-17 ENCOUNTER — Encounter: Payer: Self-pay | Admitting: Medical-Surgical

## 2023-05-17 ENCOUNTER — Ambulatory Visit: Payer: BC Managed Care – PPO | Admitting: Sports Medicine

## 2023-05-17 ENCOUNTER — Ambulatory Visit: Payer: BC Managed Care – PPO

## 2023-05-17 DIAGNOSIS — M5412 Radiculopathy, cervical region: Secondary | ICD-10-CM

## 2023-05-17 MED ORDER — PREDNISONE 50 MG PO TABS: ORAL_TABLET | ORAL | 0 refills | Status: DC

## 2023-05-17 MED ORDER — CYCLOBENZAPRINE HCL 10 MG PO TABS: ORAL_TABLET | ORAL | 0 refills | Status: DC

## 2023-05-17 NOTE — Progress Notes (Signed)
    Procedures performed today:    None.  Independent interpretation of notes and tests performed by another provider:   None.  Brief History, Exam, Impression, and Recommendations:    Cervical radiculopathy Very pleasant 62 year old female, she has had on and off pain in her neck, in the distant past she had some left sided radicular symptoms, more recently she has had increasing pain in the neck with radiation down the right arm to the fourth and fifth fingers. Moderate pain. On exam she has good reflexes to biceps, brachioradialis and triceps, negative Hoffmann sign bilaterally. Good strength and motion. I discussed the likely pathology here as well as the treatment protocol, adding 5 days of steroids, muscle relaxers at night, x-rays and formal PT, return to see me in 6 weeks, MR for interventional/injection/epidural planning if not better.    ____________________________________________ Ihor Austin. Benjamin Stain, M.D., ABFM., CAQSM., AME. Primary Care and Sports Medicine [facility]  Adjunct Professor of Family Medicine  [employer]  Restaurant manager, fast food

## 2023-05-17 NOTE — Assessment & Plan Note (Signed)
Very pleasant 62 year old female, she has had on and off pain in her neck, in the distant past she had some left sided radicular symptoms, more recently she has had increasing pain in the neck with radiation down the right arm to the fourth and fifth fingers. Moderate pain. On exam she has good reflexes to biceps, brachioradialis and triceps, negative Hoffmann sign bilaterally. Good strength and motion. I discussed the likely pathology here as well as the treatment protocol, adding 5 days of steroids, muscle relaxers at night, x-rays and formal PT, return to see me in 6 weeks, MR for interventional/injection/epidural planning if not better.

## 2023-05-18 ENCOUNTER — Encounter: Payer: Self-pay | Admitting: Sports Medicine

## 2023-05-18 DIAGNOSIS — M5412 Radiculopathy, cervical region: Secondary | ICD-10-CM

## 2023-05-21 ENCOUNTER — Other Ambulatory Visit: Payer: Self-pay

## 2023-05-21 ENCOUNTER — Ambulatory Visit: Payer: BC Managed Care – PPO | Attending: Sports Medicine

## 2023-05-21 DIAGNOSIS — M6281 Muscle weakness (generalized): Secondary | ICD-10-CM | POA: Insufficient documentation

## 2023-05-21 DIAGNOSIS — M5412 Radiculopathy, cervical region: Secondary | ICD-10-CM | POA: Diagnosis present

## 2023-05-21 NOTE — Therapy (Signed)
OUTPATIENT PHYSICAL THERAPY CERVICAL EVALUATION   Patient Name: Kristina Kelley MRN: [id number] DOB:[birth year], 62 y.o., female Today's Date: 05/21/2023  END OF SESSION:  PT End of Session - 05/21/23 1532     Visit Number 1    Number of Visits 13    Date for PT Re-Evaluation 07/07/23    Authorization Type BCBS    PT Start Time 1532    PT Stop Time 1608    PT Time Calculation (min) 36 min    Activity Tolerance Patient tolerated treatment well    Behavior During Therapy [facility] for tasks assessed/performed             Past Medical History:  Diagnosis Date   Asthma    Hypertension    Thyroid disease    Past Surgical History:  Procedure Laterality Date   ABDOMINAL HYSTERECTOMY     CESAREAN SECTION     THYROID SURGERY     Patient Active Problem List   Diagnosis Date Noted   Asthma 03/21/2022   Gastro-esophageal reflux disease without esophagitis 03/21/2022   Hypertension 03/21/2022   Moderate persistent asthma 03/21/2022   Malignant melanoma of torso excluding breast (HCC) 06/08/2020   Cervical radiculopathy 09/12/2017   Basal cell carcinoma 08/03/2015   Hypothyroidism 08/03/2015   Osteoarthritis of right knee 02/24/2014   Allergic rhinitis 08/25/2011   Hypervitaminosis B6 06/26/2011    PCP: Christen Butter, NP  REFERRING PROVIDER: Monica Becton, MD   REFERRING DIAG: M54.12 (ICD-10-CM) - Cervical radiculopathy   THERAPY DIAG:  Radiculopathy, cervical region  Muscle weakness (generalized)  Rationale for Evaluation and Treatment: Rehabilitation  ONSET DATE: 05/12/23  SUBJECTIVE:                                                                                                                                                                                                         SUBJECTIVE STATEMENT: Patient reports pain on the Rt side of her mid/upper back that goes down the back of the Rt arm and to digits 4-5. The tingling typically can start in digits 4-5  and then spreads to the rest of her fingers and hand. She gets a tingling sensation down the arm too and can get a lot of pain in the back of the elbow. Last Saturday she felt a twinge in her mid back when she went to get in bed and she went to yoga last Monday and that's when the pain began to shoot down the arm. She had had PT before for her neck due to trap pain/tightness >15 years  ago. She saw Dr. Karie Schwalbe on Thursday and was given prednisone that gives short-term relief. 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 Standing feels better, but sitting and laying down causes more pain.  Hand dominance: Right  PERTINENT HISTORY:  Thyroid surgery   PAIN:  Are you having pain? Yes: NPRS scale: 9/10 Pain location: Posterior RUE,mid/upper back Pain description: tingling,knot Aggravating factors: sitting, laying, turning and bending neck  Relieving factors: prednisone, holding the arm overhead  PRECAUTIONS: None  RED FLAGS: None     WEIGHT BEARING RESTRICTIONS: No  FALLS:  Has patient fallen in last 6 months? No  LIVING ENVIRONMENT: Lives with: lives with their spouse Lives in: House/apartment Stairs: Yes: Internal: flight steps; on left going up Has following equipment at home: None  OCCUPATION: Corporate investment banker, desk job  PLOF: Independent  PATIENT GOALS: "I would like you to fix my back and my arm."   NEXT MD VISIT: 06/28/23  OBJECTIVE:  Note: Objective measures were completed at Evaluation unless otherwise noted.  DIAGNOSTIC FINDINGS:  X-ray pending   PATIENT SURVEYS:  FOTO 50% function to 65% predicted  COGNITION: Overall cognitive status: Within functional limits for tasks assessed  SENSATION: Light touch: WFL  POSTURE: rounded shoulders and forward head  PALPATION: Not assessed    CERVICAL ROM:   Active ROM A/PROM (deg) eval  Flexion 50 pain  Extension 43  Right lateral flexion 25  Left lateral flexion 25  Right rotation 55 pain   Left rotation 55    (Blank rows = not tested)  UPPER EXTREMITY ROM:  Active ROM Right eval Left eval  Shoulder flexion    Shoulder extension    Shoulder abduction    Shoulder adduction    Shoulder extension    Shoulder internal rotation    Shoulder external rotation    Elbow flexion    Elbow extension    Wrist flexion    Wrist extension    Wrist ulnar deviation    Wrist radial deviation    Wrist pronation    Wrist supination     (Blank rows = not tested)  UPPER EXTREMITY MMT:  MMT Right eval Left eval  Shoulder flexion    Shoulder extension    Shoulder abduction 5 5  Shoulder adduction    Shoulder extension    Shoulder internal rotation    Shoulder external rotation    Middle trapezius    Lower trapezius    Elbow flexion 5 5  Elbow extension 5 5  Wrist flexion 5 5  Wrist extension 4 5  Wrist ulnar deviation    Wrist radial deviation    Wrist pronation    Finger abduction  3+ 4+  Grip strength 15 45   (Blank rows = not tested)  CERVICAL SPECIAL TESTS:  Spurling's (+) Cervical Distraction (+)  Hoffman's (-)   FUNCTIONAL TESTS:  Not tested   [facility] Adult PT Treatment:                                                DATE: 05/21/23 Therapeutic Exercise: Demonstrated and issued initial HEP.     PATIENT EDUCATION:  Education details: see treatment; POC; assessment findings  Person educated: Patient Education method: Explanation, Demonstration, Tactile cues, Verbal cues, and Handouts Education comprehension: verbalized understanding, returned demonstration, verbal cues required, tactile cues required, and  needs further education  HOME EXERCISE PROGRAM: Access Code: [email or web address] URL: [email or web address] Date: 05/21/2023 Prepared by: Letitia Libra  Exercises - Seated Cervical Retraction and Extension  - 6 x daily - 7 x weekly - 1 sets - 10 reps - Putty Squeezes  - 2 x daily - 7 x weekly - 1 sets - 10 reps  ASSESSMENT:  CLINICAL IMPRESSION: Patient is a 62  y.o. female who was seen today for physical therapy evaluation and treatment for cervical radiculopathy. Her pain began last Saturday when she went to lay down in bed and felt immediate pain in her upper/mid back. She then began experiencing radicular pain about the RUE during yoga last Monday. She has positive radicular special testing and associated wrist extensor, grip and finger abduction weakness. She appears to respond positively to repeated movement, so this was issued as part of HEP in addition to grip strengthening. She will benefit from skilled PT to address the above stated deficits in order to optimize her function and assist in overall pain reduction.   OBJECTIVE IMPAIRMENTS: decreased activity tolerance, decreased knowledge of condition, decreased ROM, decreased strength, impaired sensation, impaired UE functional use, postural dysfunction, and pain.   ACTIVITY LIMITATIONS: carrying, lifting, sitting, sleeping, and reach over head  PARTICIPATION LIMITATIONS: meal prep, cleaning, laundry, shopping, community activity, and occupation  PERSONAL FACTORS: Age and Profession are also affecting patient's functional outcome.   REHAB POTENTIAL: Good  CLINICAL DECISION MAKING: Stable/uncomplicated  EVALUATION COMPLEXITY: Low   GOALS: Goals reviewed with patient? Yes  SHORT TERM GOALS: Target date: 06/11/2023    Patient will be independent and compliant with initial HEP.   Baseline: issued at eval Goal status: INITIAL  2.  Patient will report no symptoms distal to the elbow indicative of improvement in her current condition.  Baseline: paraesthesia mostly digits 4-5   Goal status: INITIAL  3.  Patient will demonstrate at least 25 lbs of Rt grip strength to improve ability to carry items.  Baseline: see above  Goal status: INITIAL  LONG TERM GOALS: Target date: 07/07/23  Patient will score at least 65% on FOTO to signify clinically meaningful improvement in functional abilities.    Baseline: 50 Goal status: INITIAL  2.  Patient will demonstrate functional and pain free cervical AROM to improve ability to complete desk work.  Baseline: see above  Goal status: INITIAL  3.  Patient will demonstrate 5/5 Rt wrist extensor strength to improve ability to lift/carry items.  Baseline: see above  Goal status: INITIAL  4.  Patient will report pain at worst rated as </= 2/10 to reduce current functional limitations.  Baseline: 9 Goal status: INITIAL    PLAN:  PT FREQUENCY: 2x/week  PT DURATION: 6 weeks  PLANNED INTERVENTIONS: 97164- PT Re-evaluation, 97110-Therapeutic exercises, 97530- Therapeutic activity, [id number]- Neuromuscular re-education, 97535- Self Care, [id number]- Manual therapy, [id number]- Aquatic Therapy, 97014- Electrical stimulation (unattended), [id number]- Electrical stimulation (manual), [id number]- Traction (mechanical), Taping, Dry Needling, Cryotherapy, and Moist heat  PLAN FOR NEXT SESSION: review and progress HEP prn; trial mechanical traction; response to repeated movement; manual distraction, wrist extensor strengthening.   Letitia Libra, PT, DPT, ATC 05/21/23 4:20 PM

## 2023-05-22 ENCOUNTER — Ambulatory Visit: Payer: BC Managed Care – PPO

## 2023-05-22 DIAGNOSIS — M5412 Radiculopathy, cervical region: Secondary | ICD-10-CM

## 2023-05-22 DIAGNOSIS — M6281 Muscle weakness (generalized): Secondary | ICD-10-CM

## 2023-05-22 MED ORDER — PREDNISONE 10 MG (48) PO TBPK: ORAL_TABLET | Freq: Every day | ORAL | 0 refills | Status: DC

## 2023-05-22 NOTE — Therapy (Signed)
OUTPATIENT PHYSICAL THERAPY CERVICAL TREATMENT   Patient Name: Kristina Kelley MRN: [id number] DOB:[birth year], 62 y.o., female Today's Date: 05/22/2023  END OF SESSION:  PT End of Session - 05/22/23 1448     Visit Number 2    Number of Visits 13    Date for PT Re-Evaluation 07/07/23    Authorization Type BCBS    PT Start Time 1447    PT Stop Time 1526    PT Time Calculation (min) 39 min    Activity Tolerance Patient tolerated treatment well    Behavior During Therapy WFL for tasks assessed/performed              Past Medical History:  Diagnosis Date   Asthma    Hypertension    Thyroid disease    Past Surgical History:  Procedure Laterality Date   ABDOMINAL HYSTERECTOMY     CESAREAN SECTION     THYROID SURGERY     Patient Active Problem List   Diagnosis Date Noted   Asthma 03/21/2022   Gastro-esophageal reflux disease without esophagitis 03/21/2022   Hypertension 03/21/2022   Moderate persistent asthma 03/21/2022   Malignant melanoma of torso excluding breast (HCC) 06/08/2020   Cervical radiculopathy 09/12/2017   Basal cell carcinoma 08/03/2015   Hypothyroidism 08/03/2015   Osteoarthritis of right knee 02/24/2014   Allergic rhinitis 08/25/2011   Hypervitaminosis B6 06/26/2011    PCP: Christen Butter, NP  REFERRING PROVIDER: Monica Becton, MD   REFERRING DIAG: M54.12 (ICD-10-CM) - Cervical radiculopathy   THERAPY DIAG:  Radiculopathy, cervical region  Muscle weakness (generalized)  Rationale for Evaluation and Treatment: Rehabilitation  ONSET DATE: 05/12/23  SUBJECTIVE:                                                                                                                                                                                                         SUBJECTIVE STATEMENT: "Feeling about the same." Repeated movement didn't really change her arm pain.   EVAL: Patient reports pain on the Rt side of her mid/upper back that goes down  the back of the Rt arm and to digits 4-5. The tingling typically can start in digits 4-5 and then spreads to the rest of her fingers and hand. She gets a tingling sensation down the arm too and can get a lot of pain in the back of the elbow. Last Saturday she felt a twinge in her mid back when she went to get in bed and she went to yoga last Monday and that's when the pain began to shoot down  the arm. She had had PT before for her neck due to trap pain/tightness >15 years ago. She saw Dr. Karie Schwalbe on Thursday and was given prednisone that gives short-term relief. 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 Standing feels better, but sitting and laying down causes more pain.  Hand dominance: Right  PERTINENT HISTORY:  Thyroid surgery   PAIN:  Are you having pain? Yes: NPRS scale: 9/10 Pain location: Posterior RUE,mid/upper back Pain description: tingling,"somebody holding it really tight."  Aggravating factors: sitting, laying, turning and bending neck  Relieving factors: prednisone, holding the arm overhead  PRECAUTIONS: None  RED FLAGS: None     WEIGHT BEARING RESTRICTIONS: No  FALLS:  Has patient fallen in last 6 months? No  LIVING ENVIRONMENT: Lives with: lives with their spouse Lives in: House/apartment Stairs: Yes: Internal: flight steps; on left going up Has following equipment at home: None  OCCUPATION: Corporate investment banker, desk job  PLOF: Independent  PATIENT GOALS: "I would like you to fix my back and my arm."   NEXT MD VISIT: 06/28/23  OBJECTIVE:  Note: Objective measures were completed at Evaluation unless otherwise noted.  DIAGNOSTIC FINDINGS:  X-ray pending   PATIENT SURVEYS:  FOTO 50% function to 65% predicted  COGNITION: Overall cognitive status: Within functional limits for tasks assessed  SENSATION: Light touch: WFL  POSTURE: rounded shoulders and forward head  PALPATION: Not assessed    CERVICAL ROM:   Active ROM A/PROM (deg) eval  Flexion 50  pain  Extension 43  Right lateral flexion 25  Left lateral flexion 25  Right rotation 55 pain   Left rotation 55   (Blank rows = not tested)  UPPER EXTREMITY ROM:  Active ROM Right eval Left eval  Shoulder flexion    Shoulder extension    Shoulder abduction    Shoulder adduction    Shoulder extension    Shoulder internal rotation    Shoulder external rotation    Elbow flexion    Elbow extension    Wrist flexion    Wrist extension    Wrist ulnar deviation    Wrist radial deviation    Wrist pronation    Wrist supination     (Blank rows = not tested)  UPPER EXTREMITY MMT:  MMT Right eval Left eval  Shoulder flexion    Shoulder extension    Shoulder abduction 5 5  Shoulder adduction    Shoulder extension    Shoulder internal rotation    Shoulder external rotation    Middle trapezius    Lower trapezius    Elbow flexion 5 5  Elbow extension 5 5  Wrist flexion 5 5  Wrist extension 4 5  Wrist ulnar deviation    Wrist radial deviation    Wrist pronation    Finger abduction  3+ 4+  Grip strength 15 45   (Blank rows = not tested)  CERVICAL SPECIAL TESTS:  Spurling's (+) Cervical Distraction (+)  Hoffman's (-)   FUNCTIONAL TESTS:  Not tested  [facility] Adult PT Treatment:                                                DATE: 05/22/23 Therapeutic Exercise: Supine cervical retraction x 10  Resisted wrist extension green band x 10  Updated HEP  Manual Therapy: Manual cervical distraction 5 x 1 minute  Ulnar and radial nerve glides x 5 each   Modalities: Mechanical cervical traction x 12 minutes 18 lb max, 8 lb min, 3 step progression, regression   [facility] Adult PT Treatment:                                                DATE: 05/21/23 Therapeutic Exercise: Demonstrated and issued initial HEP.     PATIENT EDUCATION:  Education details:HEP update  Person educated: Patient Education method: Explanation, Demonstration, Tactile cues, Verbal cues, and  Handouts Education comprehension: verbalized understanding, returned demonstration, verbal cues required, tactile cues required, and needs further education  HOME EXERCISE PROGRAM: Access Code: [email or web address] URL: [email or web address] Date: 05/22/2023 Prepared by: Letitia Libra  Exercises - Seated Cervical Retraction and Extension  - 6 x daily - 7 x weekly - 1 sets - 10 reps - Putty Squeezes  - 2 x daily - 7 x weekly - 1 sets - 10 reps - Wrist Extension with Resistance  - 2 x daily - 7 x weekly - 2 sets - 10 reps  ASSESSMENT:  CLINICAL IMPRESSION: Patient reports no change in her radicular pain following evaluation yesterday. Does not feel that repeated movement had any effect on her pain. She responds well to manual cervical distraction reporting a reduction in pain and paraesthesia, but this relief is short-lived. Trial of mechanical cervical traction with patient reporting a reduction in pain about the RUE, but not change in tingling sensation. Added wrist extensor strengthening to HEP and recommended that patient continue with repeated movement as it might be too soon to determine overall effect on her pain as this was just prescribed yesterday afternoon at initial evaluation.   EVAL: Patient is a 62 y.o. female who was seen today for physical therapy evaluation and treatment for cervical radiculopathy. Her pain began last Saturday when she went to lay down in bed and felt immediate pain in her upper/mid back. She then began experiencing radicular pain about the RUE during yoga last Monday. She has positive radicular special testing and associated wrist extensor, grip and finger abduction weakness. She appears to respond positively to repeated movement, so this was issued as part of HEP in addition to grip strengthening. She will benefit from skilled PT to address the above stated deficits in order to optimize her function and assist in overall pain reduction.   OBJECTIVE  IMPAIRMENTS: decreased activity tolerance, decreased knowledge of condition, decreased ROM, decreased strength, impaired sensation, impaired UE functional use, postural dysfunction, and pain.   ACTIVITY LIMITATIONS: carrying, lifting, sitting, sleeping, and reach over head  PARTICIPATION LIMITATIONS: meal prep, cleaning, laundry, shopping, community activity, and occupation  PERSONAL FACTORS: Age and Profession are also affecting patient's functional outcome.   REHAB POTENTIAL: Good  CLINICAL DECISION MAKING: Stable/uncomplicated  EVALUATION COMPLEXITY: Low   GOALS: Goals reviewed with patient? Yes  SHORT TERM GOALS: Target date: 06/11/2023    Patient will be independent and compliant with initial HEP.   Baseline: issued at eval Goal status: INITIAL  2.  Patient will report no symptoms distal to the elbow indicative of improvement in her current condition.  Baseline: paraesthesia mostly digits 4-5   Goal status: INITIAL  3.  Patient will demonstrate at least 25 lbs of Rt grip strength to improve ability to carry items.  Baseline: see above  Goal status: INITIAL  LONG TERM  GOALS: Target date: 07/07/23  Patient will score at least 65% on FOTO to signify clinically meaningful improvement in functional abilities.   Baseline: 50 Goal status: INITIAL  2.  Patient will demonstrate functional and pain free cervical AROM to improve ability to complete desk work.  Baseline: see above  Goal status: INITIAL  3.  Patient will demonstrate 5/5 Rt wrist extensor strength to improve ability to lift/carry items.  Baseline: see above  Goal status: INITIAL  4.  Patient will report pain at worst rated as </= 2/10 to reduce current functional limitations.  Baseline: 9 Goal status: INITIAL    PLAN:  PT FREQUENCY: 2x/week  PT DURATION: 6 weeks  PLANNED INTERVENTIONS: 97164- PT Re-evaluation, 97110-Therapeutic exercises, 97530- Therapeutic activity, [id number]- Neuromuscular re-education,  97535- Self Care, 09811- Manual therapy, [id number]- Aquatic Therapy, 97014- Electrical stimulation (unattended), [id number]- Electrical stimulation (manual), [id number]- Traction (mechanical), Taping, Dry Needling, Cryotherapy, and Moist heat  PLAN FOR NEXT SESSION: review and progress HEP prn; mechanical traction response; response to repeated movement; manual distraction, wrist extensor strengthening. Postural strengthening   Letitia Libra, PT, DPT, ATC 05/22/23 3:27 PM

## 2023-05-22 NOTE — Telephone Encounter (Deleted)

## 2023-05-31 ENCOUNTER — Encounter: Payer: Self-pay | Admitting: Rehabilitative and Restorative Service Providers"

## 2023-05-31 ENCOUNTER — Ambulatory Visit: Payer: BC Managed Care – PPO | Admitting: Rehabilitative and Restorative Service Providers"

## 2023-05-31 DIAGNOSIS — M5412 Radiculopathy, cervical region: Secondary | ICD-10-CM | POA: Diagnosis not present

## 2023-05-31 DIAGNOSIS — M6281 Muscle weakness (generalized): Secondary | ICD-10-CM

## 2023-05-31 NOTE — Therapy (Signed)
OUTPATIENT PHYSICAL THERAPY CERVICAL TREATMENT   Patient Name: Kristina Kelley MRN: [id number] DOB:Feb 06, 1961, 62 y.o., female Today's Date: 05/31/2023  END OF SESSION:  PT End of Session - 05/31/23 1148     Visit Number 3    Number of Visits 13    Date for PT Re-Evaluation 07/07/23    Authorization Type [employer]    PT Start Time 1148    PT Stop Time 1230    PT Time Calculation (min) 42 min    Activity Tolerance Patient tolerated treatment well    Behavior During Therapy WFL for tasks assessed/performed              Past Medical History:  Diagnosis Date   Asthma    Hypertension    Thyroid disease    Past Surgical History:  Procedure Laterality Date   ABDOMINAL HYSTERECTOMY     CESAREAN SECTION     THYROID SURGERY     Patient Active Problem List   Diagnosis Date Noted   Asthma 03/21/2022   Gastro-esophageal reflux disease without esophagitis 03/21/2022   Hypertension 03/21/2022   Moderate persistent asthma 03/21/2022   Malignant melanoma of torso excluding breast (HCC) 06/08/2020   Cervical radiculopathy 09/12/2017   Basal cell carcinoma 08/03/2015   Hypothyroidism 08/03/2015   Osteoarthritis of right knee 02/24/2014   Allergic rhinitis 08/25/2011   Hypervitaminosis B6 06/26/2011    PCP: Christen Butter, NP  REFERRING PROVIDER: Monica Becton, MD   REFERRING DIAG: M54.12 (ICD-10-CM) - Cervical radiculopathy   THERAPY DIAG:  Radiculopathy, cervical region  Muscle weakness (generalized)  Rationale for Evaluation and Treatment: Rehabilitation  ONSET DATE: 05/12/23  SUBJECTIVE:                                                                                                                                                                                                         SUBJECTIVE STATEMENT: The patient reports traction reduced pain the day of and then pain returned the next day.  EVAL: Patient reports pain on the Rt side of her mid/upper back that  goes down the back of the Rt arm and to digits 4-5. The tingling typically can start in digits 4-5 and then spreads to the rest of her fingers and hand. She gets a tingling sensation down the arm too and can get a lot of pain in the back of the elbow. Last Saturday she felt a twinge in her mid back when she went to get in bed and she went to yoga last Monday and that's when the pain began  to shoot down the arm. She had had PT before for her neck due to trap pain/tightness >15 years ago. She saw Dr. Karie Schwalbe on Thursday and was given prednisone that gives short-term relief. 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 Standing feels better, but sitting and laying down causes more pain.  Hand dominance: Right  PERTINENT HISTORY:  Thyroid surgery   PAIN:  Are you having pain? Yes: NPRS scale: 8/10 Pain location: Posterior RUE,mid/upper back Pain description: tingling,"somebody holding it really tight."  Aggravating factors: sitting, laying, turning and bending neck  Relieving factors: prednisone, holding the arm overhead  PRECAUTIONS: None  WEIGHT BEARING RESTRICTIONS: No  FALLS:  Has patient fallen in last 6 months? No  LIVING ENVIRONMENT: Lives with: lives with their spouse Lives in: House/apartment Stairs: Yes: Internal: flight steps; on left going up Has following equipment at home: None  OCCUPATION: Corporate investment banker, desk job  PLOF: Independent  PATIENT GOALS: "I would like you to fix my back and my arm."   NEXT MD VISIT: 06/28/23  OBJECTIVE:  Note: Objective measures were completed at Evaluation unless otherwise noted.  DIAGNOSTIC FINDINGS:  X-ray pending   PATIENT SURVEYS:  FOTO 50% function to 65% predicted  COGNITION: Overall cognitive status: Within functional limits for tasks assessed  SENSATION: Light touch: WFL  POSTURE: rounded shoulders and forward head  PALPATION: Not assessed    CERVICAL ROM:   Active ROM A/PROM (deg) eval  Flexion 50 pain   Extension 43  Right lateral flexion 25  Left lateral flexion 25  Right rotation 55 pain   Left rotation 55   (Blank rows = not tested)  UPPER EXTREMITY ROM:  Active ROM Right eval Left eval  Shoulder flexion    Shoulder extension    Shoulder abduction    Shoulder adduction    Shoulder extension    Shoulder internal rotation    Shoulder external rotation    Elbow flexion    Elbow extension    Wrist flexion    Wrist extension    Wrist ulnar deviation    Wrist radial deviation    Wrist pronation    Wrist supination     (Blank rows = not tested)  UPPER EXTREMITY MMT:  MMT Right eval Left eval  Shoulder flexion    Shoulder extension    Shoulder abduction 5 5  Shoulder adduction    Shoulder extension    Shoulder internal rotation    Shoulder external rotation    Middle trapezius    Lower trapezius    Elbow flexion 5 5  Elbow extension 5 5  Wrist flexion 5 5  Wrist extension 4 5  Wrist ulnar deviation    Wrist radial deviation    Wrist pronation    Finger abduction  3+ 4+  Grip strength 15 45   (Blank rows = not tested)  CERVICAL SPECIAL TESTS:  Spurling's (+) Cervical Distraction (+)  Hoffman's (-)   FUNCTIONAL TESTS:  Not tested   [facility] Adult PT Treatment:                                                DATE: 05/31/23 Therapeutic Exercise: Supine Deflated ball head nods x 12 reps Deflated ball chin tucks x 12 reps Prone Scapular squeeze x 12 reps Bilateral row x 12 reps Manual Therapy: R  scapular mobilization in L sidelying with trigger point release infraspinatus, rhomboids  Trigger Point Dry-Needling  Treatment instructions: Expect mild to moderate muscle soreness. S/S of pneumothorax if dry needled over a lung field, and to seek immediate medical attention should they occur. Patient verbalized understanding of these instructions and education. Patient Consent Given: Yes Education handout provided: Yes Muscles treated: R infraspinatus, R  teres Treatment response/outcome: palpable lengthening  Modalities: Hot pack upper back during traction Mechanical cervical traction x 12 minutes 18 lbs/8 lbs min 3 step progression/regression  [facility] Adult PT Treatment:                                                DATE: 05/22/23 Therapeutic Exercise: Supine cervical retraction x 10  Resisted wrist extension green band x 10  Updated HEP  Manual Therapy: Manual cervical distraction 5 x 1 minute Ulnar and radial nerve glides x 5 each   Modalities: Mechanical cervical traction x 12 minutes 18 lb max, 8 lb min, 3 step progression, regression   [facility] Adult PT Treatment:                                                DATE: 05/21/23 Therapeutic Exercise: Demonstrated and issued initial HEP.     PATIENT EDUCATION:  Education details:HEP update  Person educated: Patient Education method: Explanation, Demonstration, Tactile cues, Verbal cues, and Handouts Education comprehension: verbalized understanding, returned demonstration, verbal cues required, tactile cues required, and needs further education  HOME EXERCISE PROGRAM: Access Code: [email or web address] URL: [email or web address] Date: 05/31/2023 Prepared by: Margretta Ditty  Exercises - Seated Cervical Retraction and Extension  - 6 x daily - 7 x weekly - 1 sets - 10 reps - Putty Squeezes  - 2 x daily - 7 x weekly - 1 sets - 10 reps - Wrist Extension with Resistance  - 2 x daily - 7 x weekly - 2 sets - 10 reps - Prone Scapular Slide with Shoulder Extension  - 2 x daily - 7 x weekly - 1 sets - 10 reps  ASSESSMENT:  CLINICAL IMPRESSION: The patient got some relief after traction last session. Pain is periscapular with trigger points noted in infraspinatus, rhomboids, and teres. PT added dry needling today + continued cervical traction. She tolerated scapular stabilization activities well-- PT to progress to tolerance.   EVAL: Patient is a 62 y.o. female who was seen today  for physical therapy evaluation and treatment for cervical radiculopathy. Her pain began last Saturday when she went to lay down in bed and felt immediate pain in her upper/mid back. She then began experiencing radicular pain about the RUE during yoga last Monday. She has positive radicular special testing and associated wrist extensor, grip and finger abduction weakness. She appears to respond positively to repeated movement, so this was issued as part of HEP in addition to grip strengthening. She will benefit from skilled PT to address the above stated deficits in order to optimize her function and assist in overall pain reduction.   OBJECTIVE IMPAIRMENTS: decreased activity tolerance, decreased knowledge of condition, decreased ROM, decreased strength, impaired sensation, impaired UE functional use, postural dysfunction, and pain.   ACTIVITY LIMITATIONS: carrying, lifting, sitting, sleeping, and reach over head  PARTICIPATION LIMITATIONS: meal prep, cleaning, laundry, shopping, community activity, and occupation  PERSONAL FACTORS: Age and Profession are also affecting patient's functional outcome.   REHAB POTENTIAL: Good  CLINICAL DECISION MAKING: Stable/uncomplicated  EVALUATION COMPLEXITY: Low   GOALS: Goals reviewed with patient? Yes  SHORT TERM GOALS: Target date: 06/11/2023  Patient will be independent and compliant with initial HEP.  Baseline: issued at eval Goal status: INITIAL  2.  Patient will report no symptoms distal to the elbow indicative of improvement in her current condition.  Baseline: paraesthesia mostly digits 4-5   Goal status: INITIAL  3.  Patient will demonstrate at least 25 lbs of Rt grip strength to improve ability to carry items.  Baseline: see above  Goal status: INITIAL  LONG TERM GOALS: Target date: 07/07/23  Patient will score at least 65% on FOTO to signify clinically meaningful improvement in functional abilities.  Baseline: 50 Goal status:  INITIAL  2.  Patient will demonstrate functional and pain free cervical AROM to improve ability to complete desk work.  Baseline: see above  Goal status: INITIAL  3.  Patient will demonstrate 5/5 Rt wrist extensor strength to improve ability to lift/carry items.  Baseline: see above  Goal status: INITIAL  4.  Patient will report pain at worst rated as </= 2/10 to reduce current functional limitations.  Baseline: 9 Goal status: INITIAL  PLAN:  PT FREQUENCY: 2x/week  PT DURATION: 6 weeks  PLANNED INTERVENTIONS: 97164- PT Re-evaluation, 97110-Therapeutic exercises, 97530- Therapeutic activity, [id number]- Neuromuscular re-education, 97535- Self Care, [id number]- Manual therapy, [id number]- Aquatic Therapy, 97014- Electrical stimulation (unattended), [id number]- Electrical stimulation (manual), [id number]- Traction (mechanical), Taping, Dry Needling, Cryotherapy, and Moist heat  PLAN FOR NEXT SESSION: review and progress HEP prn; mechanical traction response; response to repeated movement; manual distraction, wrist extensor strengthening. Postural strengthening  Check on grip strength and wrist extension MMT.  Blade Scheff, PT 05/31/23 11:49 AM

## 2023-05-31 NOTE — Patient Instructions (Addendum)
 Trigger Point Dry Needling  What is Trigger Point Dry Needling (DN)? o DN is a physical therapy technique used to treat muscle pain and dysfunction. Specifically, DN  helps deactivate muscle trigger points (muscle knots).  o A thin filiform needle is used to penetrate the skin and stimulate the underlying trigger point. The  goal is for a local twitch response (LTR) to occur and for the trigger point to relax. No medication  of any kind is injected during the procedure.   What Does Trigger Point Dry Needling Feel Like?  o The procedure feels different for each individual patient. Some patients report that they do not  actually feel the needle enter the skin and overall the process is not painful. Very mild bleeding  may occur. However, many patients feel a deep cramping in the muscle in which the needle  was inserted. This is the local twitch response.   How Will I feel after the treatment? o Soreness is normal, and the onset of soreness may not occur for a few hours. Typically this  soreness does not last longer than two days.  o Bruising is uncommon, however; ice can be used to decrease any possible bruising.  o In rare cases feeling tired or nauseous after the treatment is normal. In addition, your symptoms  may get worse before they get better, this period will typically not last longer than 24 hours.   What Can I do After My Treatment? o Increase your hydration by drinking more water for the next 24 hours.  o You may place ice or heat on the areas treated that have become sore, however, do not use  heat on inflamed or bruised areas. Heat often brings more relief post needling. o You can continue your regular activities, but vigorous activity is not recommended initially after  the treatment for 24 hours. o DN is best combined with other physical therapy such as strengthening, stretching, and other  therapies.   What are the complications? o While your therapist has had extensive  training in minimizing the risks of trigger point dry  needling, it is important to understand the risks of any procedure.  o Risks include bleeding, pain, fatigue, hematoma, infection, vertigo, nausea or nerve  involvement. Monitor for any changes to your skin or sensation. Contact your therapist or MD  with concerns.  o A rare but serious complication is a pneumothorax over or near your middle and upper chest  and back o If you have dry needling in this area, monitor for the following symptoms: ? Shortness of breath on exertion and/or ? Difficulty taking a deep breath and/or ? Chest Pain and/or ? A dry cough o If any of the above symptoms develop, please go to the nearest emergency room or call 911.  Tell them you had dry needling over your thorax and report any symptoms you are having.  Please follow-up with your treating therapist after you complete the medical evaluation.

## 2023-06-03 NOTE — Therapy (Unsigned)
OUTPATIENT PHYSICAL THERAPY CERVICAL TREATMENT   Patient Name: Kristina Kelley MRN: [id number] DOB:[birth year], 62 y.o., female Today's Date: 06/04/2023  END OF SESSION:  PT End of Session - 06/04/23 0930     Visit Number 4    Number of Visits 13    Date for PT Re-Evaluation 07/07/23    Authorization Type [employer]    PT Start Time 0930    PT Stop Time 1015    PT Time Calculation (min) 45 min    Activity Tolerance Patient tolerated treatment well    Behavior During Therapy [facility] for tasks assessed/performed               Past Medical History:  Diagnosis Date   Asthma    Hypertension    Thyroid disease    Past Surgical History:  Procedure Laterality Date   ABDOMINAL HYSTERECTOMY     CESAREAN SECTION     THYROID SURGERY     Patient Active Problem List   Diagnosis Date Noted   Asthma 03/21/2022   Gastro-esophageal reflux disease without esophagitis 03/21/2022   Hypertension 03/21/2022   Moderate persistent asthma 03/21/2022   Malignant melanoma of torso excluding breast (HCC) 06/08/2020   Cervical radiculopathy 09/12/2017   Basal cell carcinoma 08/03/2015   Hypothyroidism 08/03/2015   Osteoarthritis of right knee 02/24/2014   Allergic rhinitis 08/25/2011   Hypervitaminosis B6 06/26/2011    PCP: Christen Butter, NP REFERRING PROVIDER: Monica Becton, MD  REFERRING DIAG: M54.12 (ICD-10-CM) - Cervical radiculopathy  THERAPY DIAG:  Radiculopathy, cervical region  Muscle weakness (generalized)  Rationale for Evaluation and Treatment: Rehabilitation  ONSET DATE: 05/12/23  SUBJECTIVE:                                                                                                                                                                                                         SUBJECTIVE STATEMENT: The patient got relief from dry needling and less tingling the day of traction, but then the sensory changes return the next day.  She still reports dropping items  and difficulty gripping a pen.  EVAL: Patient reports pain on the Rt side of her mid/upper back that goes down the back of the Rt arm and to digits 4-5. The tingling typically can start in digits 4-5 and then spreads to the rest of her fingers and hand. She gets a tingling sensation down the arm too and can get a lot of pain in the back of the elbow. Last Saturday she felt a twinge in her mid back when she went to  get in bed and she went to yoga last Monday and that's when the pain began to shoot down the arm. She had had PT before for her neck due to trap pain/tightness >15 years ago. She saw Dr. Karie Schwalbe on Thursday and was given prednisone that gives short-term relief. 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 Standing feels better, but sitting and laying down causes more pain.  Hand dominance: Right  PERTINENT HISTORY:  Thyroid surgery   PAIN:  Are you having pain? Yes: NPRS scale: 8/10 Pain location: Posterior RUE,mid/upper back Pain description: tingling,"somebody holding it really tight."  Aggravating factors: sitting, laying, turning and bending neck  Relieving factors: prednisone, holding the arm overhead  PRECAUTIONS: None  WEIGHT BEARING RESTRICTIONS: No  FALLS:  Has patient fallen in last 6 months? No  OCCUPATION: recruiter, desk job  PATIENT GOALS: "I would like you to fix my back and my arm."   NEXT MD VISIT: 06/28/23  OBJECTIVE:  Note: Objective measures were completed at Evaluation unless otherwise noted. DIAGNOSTIC FINDINGS:  X-ray pending   PATIENT SURVEYS:  FOTO 50% function to 65% predicted  COGNITION: Overall cognitive status: Within functional limits for tasks assessed  SENSATION: Light touch: WFL  POSTURE: rounded shoulders and forward head  PALPATION: Not assessed    CERVICAL ROM:  Active ROM A/PROM (deg) eval  Flexion 50 pain  Extension 43  Right lateral flexion 25  Left lateral flexion 25  Right rotation 55 pain   Left rotation 55    (Blank rows = not tested)  UPPER EXTREMITY ROM: Active ROM Right eval Left eval  Shoulder flexion    Shoulder extension    Shoulder abduction    Shoulder adduction    Shoulder extension    Shoulder internal rotation    Shoulder external rotation    Elbow flexion    Elbow extension    Wrist flexion    Wrist extension    Wrist ulnar deviation    Wrist radial deviation    Wrist pronation    Wrist supination     (Blank rows = not tested)  UPPER EXTREMITY MMT: MMT Right eval Left eval Right 06/04/23  Shoulder flexion     Shoulder extension     Shoulder abduction 5 5   Shoulder adduction     Shoulder extension     Shoulder internal rotation     Shoulder external rotation     Middle trapezius     Lower trapezius     Elbow flexion 5 5   Elbow extension 5 5   Wrist flexion 5 5   Wrist extension 4 5   Wrist ulnar deviation     Wrist radial deviation     Wrist pronation     Finger abduction  3+ 4+   Grip strength 15 45 20   (Blank rows = not tested)  CERVICAL SPECIAL TESTS:  Spurling's (+) Cervical Distraction (+)  Hoffman's (-)   FUNCTIONAL TESTS:  Not tested    [facility] Adult PT Treatment:                                                DATE: 06/04/23 Therapeutic Exercise: Standing  Bicep curls 5# holding weight in R UE x 12 reps Bent over single arm rows with L knee on mat table and rowing  with R UE x 5# x 15 reps Seated Wrist extension 1# x 12 reps Wrist pronation/supination 1# x 12 reps R UE Prone On elbows performing scapular protraction retraction Chin tuck against gravity on elbows x 10 reps with increased pain Supine Foam roller over thoracic spine-- with arms overhead to tolerance moving to 2 different locations perpendicular to spine for thoracic opening Manual Therapy: STM R thoracic multifidi  STM R infraspinatus, rhomboids Trigger Point Dry-Needling  Treatment instructions: Expect mild to moderate muscle soreness. Reviewed prior  signs/symptoms of pneumothorax and discussed precautions taken when DN over lung field today (bony backdrop of vertebrae and needling parallel to ribs for rhomboid. Patient verbalized understanding of these instructions and education. Patient Consent Given: Yes Education handout provided: Previously provided Muscles treated: rhomboid, thoracic multifidi Treatment response/outcome: palpable lengthening Modalities: Heat pack thoracic spine during c-spine traction Cervical traction x 12 minutes 18 max, 8 min 3 step progression   [facility] Adult PT Treatment:                                                DATE: 05/31/23 Therapeutic Exercise: Supine Deflated ball head nods x 12 reps Deflated ball chin tucks x 12 reps Prone Scapular squeeze x 12 reps Bilateral row x 12 reps Manual Therapy: R scapular mobilization in L sidelying with trigger point release infraspinatus, rhomboids  Trigger Point Dry-Needling  Treatment instructions: Expect mild to moderate muscle soreness. S/S of pneumothorax if dry needled over a lung field, and to seek immediate medical attention should they occur. Patient verbalized understanding of these instructions and education. Patient Consent Given: Yes Education handout provided: Yes Muscles treated: R infraspinatus, R teres Treatment response/outcome: palpable lengthening  Modalities: Hot pack upper back during traction Mechanical cervical traction x 12 minutes 18 lbs/8 lbs min 3 step progression/regression  [facility] Adult PT Treatment:                                                DATE: 05/22/23 Therapeutic Exercise: Supine cervical retraction x 10  Resisted wrist extension green band x 10  Updated HEP  Manual Therapy: Manual cervical distraction 5 x 1 minute Ulnar and radial nerve glides x 5 each   Modalities: Mechanical cervical traction x 12 minutes 18 lb max, 8 lb min, 3 step progression, regression   PATIENT EDUCATION:  Education details:HEP update   Person educated: Patient Education method: Explanation, Demonstration, Tactile cues, Verbal cues, and Handouts Education comprehension: verbalized understanding, returned demonstration, verbal cues required, tactile cues required, and needs further education  HOME EXERCISE PROGRAM: Access Code: [email or web address] URL: [email or web address] Date: 05/31/2023 Prepared by: Margretta Ditty  Exercises - Seated Cervical Retraction and Extension  - 6 x daily - 7 x weekly - 1 sets - 10 reps - Putty Squeezes  - 2 x daily - 7 x weekly - 1 sets - 10 reps - Wrist Extension with Resistance  - 2 x daily - 7 x weekly - 2 sets - 10 reps - Prone Scapular Slide with Shoulder Extension  - 2 x daily - 7 x weekly - 1 sets - 10 reps  ASSESSMENT:  CLINICAL IMPRESSION: The patient continues with feeling origin of pain is in mid thoracic spine.  She has point tenderness to palpation today of rhomboids and thoracic multifidi. DN performed, there ex, and traction. Patient tolerates well. The patient continues to get some relief after traction-- continue current plan. She is also gaining small amounts of grip per testing today.   EVAL: Patient is a 62 y.o. female who was seen today for physical therapy evaluation and treatment for cervical radiculopathy. Her pain began last Saturday when she went to lay down in bed and felt immediate pain in her upper/mid back. She then began experiencing radicular pain about the RUE during yoga last Monday. She has positive radicular special testing and associated wrist extensor, grip and finger abduction weakness. She appears to respond positively to repeated movement, so this was issued as part of HEP in addition to grip strengthening. She will benefit from skilled PT to address the above stated deficits in order to optimize her function and assist in overall pain reduction.   OBJECTIVE IMPAIRMENTS: decreased activity tolerance, decreased knowledge of condition, decreased ROM,  decreased strength, impaired sensation, impaired UE functional use, postural dysfunction, and pain.   GOALS: Goals reviewed with patient? Yes  SHORT TERM GOALS: Target date: 06/11/2023  Patient will be independent and compliant with initial HEP.  Baseline: issued at eval Goal status: INITIAL  2.  Patient will report no symptoms distal to the elbow indicative of improvement in her current condition.  Baseline: paraesthesia mostly digits 4-5   Goal status: INITIAL  3.  Patient will demonstrate at least 25 lbs of Rt grip strength to improve ability to carry items.  Baseline: see above  Goal status: INITIAL  LONG TERM GOALS: Target date: 07/07/23  Patient will score at least 65% on FOTO to signify clinically meaningful improvement in functional abilities.  Baseline: 50 Goal status: INITIAL  2.  Patient will demonstrate functional and pain free cervical AROM to improve ability to complete desk work.  Baseline: see above  Goal status: INITIAL  3.  Patient will demonstrate 5/5 Rt wrist extensor strength to improve ability to lift/carry items.  Baseline: see above  Goal status: INITIAL  4.  Patient will report pain at worst rated as </= 2/10 to reduce current functional limitations.  Baseline: 9 Goal status: INITIAL  PLAN:  PT FREQUENCY: 2x/week  PT DURATION: 6 weeks  PLANNED INTERVENTIONS: 97164- PT Re-evaluation, 97110-Therapeutic exercises, 97530- Therapeutic activity, [id number]- Neuromuscular re-education, 97535- Self Care, [id number]- Manual therapy, [id number]- Aquatic Therapy, 97014- Electrical stimulation (unattended), [id number]- Electrical stimulation (manual), [id number]- Traction (mechanical), Taping, Dry Needling, Cryotherapy, and Moist heat  PLAN FOR NEXT SESSION: Review and progress HEP prn; mechanical traction response; response to repeated movement; manual distraction, wrist extensor strengthening. Postural strengthening  Check goals week of 06/11/23  Haasini Patnaude, PT 06/04/23 9:30  AM

## 2023-06-04 ENCOUNTER — Encounter: Payer: Self-pay | Admitting: Rehabilitative and Restorative Service Providers"

## 2023-06-04 ENCOUNTER — Ambulatory Visit: Payer: BC Managed Care – PPO | Admitting: Rehabilitative and Restorative Service Providers"

## 2023-06-04 DIAGNOSIS — M5412 Radiculopathy, cervical region: Secondary | ICD-10-CM

## 2023-06-04 DIAGNOSIS — M6281 Muscle weakness (generalized): Secondary | ICD-10-CM

## 2023-06-05 NOTE — Therapy (Signed)
 OUTPATIENT PHYSICAL THERAPY CERVICAL TREATMENT   Patient Name: Kristina Kelley MRN: [id number] DOB:12/16/1960, 62 y.o., female Today's Date: 06/07/2023  END OF SESSION:  PT End of Session - 06/07/23 0933     Visit Number 5    Number of Visits 13    Date for PT Re-Evaluation 07/07/23    PT Start Time 0933    PT Stop Time 1014    PT Time Calculation (min) 41 min    Activity Tolerance Patient tolerated treatment well    Behavior During Therapy [facility] for tasks assessed/performed                Past Medical History:  Diagnosis Date   Asthma    Hypertension    Thyroid  disease    Past Surgical History:  Procedure Laterality Date   ABDOMINAL HYSTERECTOMY     CESAREAN SECTION     THYROID  SURGERY     Patient Active Problem List   Diagnosis Date Noted   Asthma 03/21/2022   Gastro-esophageal reflux disease without esophagitis 03/21/2022   Hypertension 03/21/2022   Moderate persistent asthma 03/21/2022   Malignant melanoma of torso excluding breast (HCC) 06/08/2020   Cervical radiculopathy 09/12/2017   Basal cell carcinoma 08/03/2015   Hypothyroidism 08/03/2015   Osteoarthritis of right knee 02/24/2014   Allergic rhinitis 08/25/2011   Hypervitaminosis B6 06/26/2011    PCP: Willo Mini, NP REFERRING PROVIDER: Curtis Debby PARAS, MD  REFERRING DIAG: M54.12 (ICD-10-CM) - Cervical radiculopathy  THERAPY DIAG:  Radiculopathy, cervical region  Muscle weakness (generalized)  Rationale for Evaluation and Treatment: Rehabilitation  ONSET DATE: 05/12/23  SUBJECTIVE:                                                                                                                                                                                                         SUBJECTIVE STATEMENT: Pain is worse in the morning. In R upper back into arm.   EVAL: Patient reports pain on the Rt side of her mid/upper back that goes down the back of the Rt arm and to digits 4-5. The tingling  typically can start in digits 4-5 and then spreads to the rest of her fingers and hand. She gets a tingling sensation down the arm too and can get a lot of pain in the back of the elbow. Last Saturday she felt a twinge in her mid back when she went to get in bed and she went to yoga last Monday and that's when the pain began to shoot down the arm. She had had PT before  for her neck due to trap pain/tightness >15 years ago. She saw Dr. ONEIDA on Thursday and was given prednisone  that gives short-term relief. 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 Standing feels better, but sitting and laying down causes more pain.  Hand dominance: Right  PERTINENT HISTORY:  Thyroid  surgery   PAIN:  Are you having pain? Yes: NPRS scale: 8/10 Pain location: Posterior RUE,mid/upper back Pain description: tingling,somebody holding it really tight.  Aggravating factors: sitting, laying, turning and bending neck  Relieving factors: prednisone , holding the arm overhead  PRECAUTIONS: None  WEIGHT BEARING RESTRICTIONS: No  FALLS:  Has patient fallen in last 6 months? No  OCCUPATION: recruiter, desk job  PATIENT GOALS: I would like you to fix my back and my arm.   NEXT MD VISIT: 06/28/23  OBJECTIVE:  Note: Objective measures were completed at Evaluation unless otherwise noted. DIAGNOSTIC FINDINGS:  X-ray pending   PATIENT SURVEYS:  FOTO 50% function to 65% predicted  COGNITION: Overall cognitive status: Within functional limits for tasks assessed  SENSATION: Light touch: WFL  POSTURE: rounded shoulders and forward head  PALPATION: Not assessed    CERVICAL ROM:  Active ROM A/PROM (deg) eval  Flexion 50 pain  Extension 43  Right lateral flexion 25  Left lateral flexion 25  Right rotation 55 pain   Left rotation 55   (Blank rows = not tested)  UPPER EXTREMITY ROM: Active ROM Right eval Left eval  Shoulder flexion    Shoulder extension    Shoulder abduction    Shoulder  adduction    Shoulder extension    Shoulder internal rotation    Shoulder external rotation    Elbow flexion    Elbow extension    Wrist flexion    Wrist extension    Wrist ulnar deviation    Wrist radial deviation    Wrist pronation    Wrist supination     (Blank rows = not tested)  UPPER EXTREMITY MMT: MMT Right eval Left eval Right 06/04/23 R 06/07/23  Shoulder flexion      Shoulder extension      Shoulder abduction 5 5    Shoulder adduction      Shoulder extension      Shoulder internal rotation      Shoulder external rotation      Middle trapezius      Lower trapezius      Elbow flexion 5 5    Elbow extension 5 5    Wrist flexion 5 5    Wrist extension 4 5    Wrist ulnar deviation      Wrist radial deviation      Wrist pronation      Finger abduction  3+ 4+    Grip strength 15 45 20 13   (Blank rows = not tested)  CERVICAL SPECIAL TESTS:  Spurling's (+) Cervical Distraction (+)  Hoffman's (-)   FUNCTIONAL TESTS:  Not tested   [facility] Adult PT Treatment:                                                DATE: 06/07/23  Therapeutic Exercise: Seated Tray stretch x 10 Seated scapular retraction x 10 On elbows performing scapular protraction/retraction - held retraction as it increases pain Chin tuck against gravity on elbows x 10 reps  Prayer nerve glide x 10 Quadriped -  T 2 x 10 1# to 90 deg B Thread the needle to [facility] rotation - painful MANUAL: Skilled palpation and monitoring of soft tissues during DN. STM to R UT, LS, Rhomboids and paraspinals Trigger Point Dry Needling  Subsequent Treatment: Instructions provided previously at initial dry needling treatment.  Instructions reviewed, if requested by the patient, prior to subsequent dry needling treatment.   Patient Verbal Consent Given: Yes Education Handout Provided: Previously Provided Muscles Treated: R UT, levator, rhomboids, cervical multifidi Electrical Stimulation Performed: No Treatment  Response/Outcome: LTR and elongation of muscles treated  [facility] Adult PT Treatment:                                                DATE: 06/04/23 Therapeutic Exercise: Standing  Bicep curls 5# holding weight in R UE x 12 reps Bent over single arm rows with L knee on mat table and rowing with R UE x 5# x 15 reps Seated Wrist extension 1# x 12 reps Wrist pronation/supination 1# x 12 reps R UE Prone On elbows performing scapular protraction retraction Chin tuck against gravity on elbows x 10 reps with increased pain Supine Foam roller over thoracic spine-- with arms overhead to tolerance moving to 2 different locations perpendicular to spine for thoracic opening Manual Therapy: STM R thoracic multifidi  STM R infraspinatus, rhomboids Trigger Point Dry-Needling  Treatment instructions: Expect mild to moderate muscle soreness. Reviewed prior signs/symptoms of pneumothorax and discussed precautions taken when DN over lung field today (bony backdrop of vertebrae and needling parallel to ribs for rhomboid. Patient verbalized understanding of these instructions and education. Patient Consent Given: Yes Education handout provided: Previously provided Muscles treated: rhomboid, thoracic multifidi Treatment response/outcome: palpable lengthening Modalities: Heat pack thoracic spine during c-spine traction Cervical traction x 12 minutes 18 max, 8 min 3 step progression   [facility] Adult PT Treatment:                                                DATE: 05/31/23 Therapeutic Exercise: Supine Deflated ball head nods x 12 reps Deflated ball chin tucks x 12 reps Prone Scapular squeeze x 12 reps Bilateral row x 12 reps Manual Therapy: R scapular mobilization in L sidelying with trigger point release infraspinatus, rhomboids  Trigger Point Dry-Needling  Treatment instructions: Expect mild to moderate muscle soreness. S/S of pneumothorax if dry needled over a lung field, and to seek immediate medical  attention should they occur. Patient verbalized understanding of these instructions and education. Patient Consent Given: Yes Education handout provided: Yes Muscles treated: R infraspinatus, R teres Treatment response/outcome: palpable lengthening  Modalities: Hot pack upper back during traction Mechanical cervical traction x 12 minutes 18 lbs/8 lbs min 3 step progression/regression   PATIENT EDUCATION:  Education details:HEP update  Person educated: Patient Education method: Explanation, Demonstration, Tactile cues, Verbal cues, and Handouts Education comprehension: verbalized understanding, returned demonstration, verbal cues required, tactile cues required, and needs further education  HOME EXERCISE PROGRAM: Access Code: [email or web address] URL: [email or web address] Date: 05/31/2023 Prepared by: Tawni Ferrier  Exercises - Seated Cervical Retraction and Extension  - 6 x daily - 7 x weekly - 1 sets - 10 reps -  Putty Squeezes  - 2 x daily - 7 x weekly - 1 sets - 10 reps - Wrist Extension with Resistance  - 2 x daily - 7 x weekly - 2 sets - 10 reps - Prone Scapular Slide with Shoulder Extension  - 2 x daily - 7 x weekly - 1 sets - 10 reps  ASSESSMENT:  CLINICAL IMPRESSION: Kristina Kelley continues to reports radicular symptoms in the R UE. She is having less of the vice grip, but still has tingling in digits 4 and 5. She feels the DN and traction help the most, but the relief doesn't last. Excellent response to TPDN today especially in the rhomboids and levator. Seated ulnar nerve glide added to HEP. Kristina Kelley continues to demonstrate potential for improvement and would benefit from continued skilled therapy to address impairments.    EVAL: Patient is a 61 y.o. female who was seen today for physical therapy evaluation and treatment for cervical radiculopathy. Her pain began last Saturday when she went to lay down in bed and felt immediate pain in her upper/mid back. She then began  experiencing radicular pain about the RUE during yoga last Monday. She has positive radicular special testing and associated wrist extensor, grip and finger abduction weakness. She appears to respond positively to repeated movement, so this was issued as part of HEP in addition to grip strengthening. She will benefit from skilled PT to address the above stated deficits in order to optimize her function and assist in overall pain reduction.   OBJECTIVE IMPAIRMENTS: decreased activity tolerance, decreased knowledge of condition, decreased ROM, decreased strength, impaired sensation, impaired UE functional use, postural dysfunction, and pain.   GOALS: Goals reviewed with patient? Yes  SHORT TERM GOALS: Target date: 06/11/2023  Patient will be independent and compliant with initial HEP.  Baseline: issued at eval Goal status: MET  2.  Patient will report no symptoms distal to the elbow indicative of improvement in her current condition.  Baseline: paraesthesia mostly digits 4-5   Goal status: ONGOING continued parasthesia, tight gripping pain is better in lower arm   3.  Patient will demonstrate at least 25 lbs of Rt grip strength to improve ability to carry items.  Baseline: see above  Goal status: ONGOING  LONG TERM GOALS: Target date: 07/07/23  Patient will score at least 65% on FOTO to signify clinically meaningful improvement in functional abilities.  Baseline: 50 Goal status: INITIAL  2.  Patient will demonstrate functional and pain free cervical AROM to improve ability to complete desk work.  Baseline: see above  Goal status: INITIAL  3.  Patient will demonstrate 5/5 Rt wrist extensor strength to improve ability to lift/carry items.  Baseline: see above  Goal status: INITIAL  4.  Patient will report pain at worst rated as </= 2/10 to reduce current functional limitations.  Baseline: 9 Goal status: INITIAL  PLAN:  PT FREQUENCY: 2x/week  PT DURATION: 6 weeks  PLANNED  INTERVENTIONS: 97164- PT Re-evaluation, 97110-Therapeutic exercises, 97530- Therapeutic activity, [id number]- Neuromuscular re-education, 97535- Self Care, [id number]- Manual therapy, [id number]- Aquatic Therapy, 97014- Electrical stimulation (unattended), [id number]- Electrical stimulation (manual), [id number]- Traction (mechanical), Taping, Dry Needling, Cryotherapy, and Moist heat  PLAN FOR NEXT SESSION: Assess ulnar nerve glide. Review and progress HEP prn; mechanical traction response; response to repeated movement; manual distraction, wrist extensor strengthening. Postural strengthening    Mliss Cummins, PT 06/07/23 10:52 AM

## 2023-06-07 ENCOUNTER — Ambulatory Visit: Payer: BC Managed Care – PPO | Attending: Sports Medicine | Admitting: Physical Therapy

## 2023-06-07 ENCOUNTER — Encounter: Payer: Self-pay | Admitting: Physical Therapy

## 2023-06-07 DIAGNOSIS — M5412 Radiculopathy, cervical region: Secondary | ICD-10-CM

## 2023-06-07 DIAGNOSIS — M6281 Muscle weakness (generalized): Secondary | ICD-10-CM | POA: Diagnosis present

## 2023-06-07 NOTE — Patient Instructions (Signed)

## 2023-06-11 ENCOUNTER — Ambulatory Visit: Payer: BC Managed Care – PPO

## 2023-06-11 DIAGNOSIS — M5412 Radiculopathy, cervical region: Secondary | ICD-10-CM | POA: Diagnosis not present

## 2023-06-11 DIAGNOSIS — M6281 Muscle weakness (generalized): Secondary | ICD-10-CM

## 2023-06-11 NOTE — Therapy (Signed)
 OUTPATIENT PHYSICAL THERAPY CERVICAL TREATMENT   Patient Name: Kristina Kelley MRN: [id number] DOB:1960-10-02, 63 y.o., female Today's Date: 06/11/2023  END OF SESSION:  PT End of Session - 06/11/23 0713     Visit Number 6    Number of Visits 13    Date for PT Re-Evaluation 07/07/23    Authorization Type [employer]    PT Start Time 0715    PT Stop Time 0810    PT Time Calculation (min) 55 min    Activity Tolerance Patient tolerated treatment well    Behavior During Therapy [facility] for tasks assessed/performed                 Past Medical History:  Diagnosis Date   Asthma    Hypertension    Thyroid  disease    Past Surgical History:  Procedure Laterality Date   ABDOMINAL HYSTERECTOMY     CESAREAN SECTION     THYROID  SURGERY     Patient Active Problem List   Diagnosis Date Noted   Asthma 03/21/2022   Gastro-esophageal reflux disease without esophagitis 03/21/2022   Hypertension 03/21/2022   Moderate persistent asthma 03/21/2022   Malignant melanoma of torso excluding breast (HCC) 06/08/2020   Cervical radiculopathy 09/12/2017   Basal cell carcinoma 08/03/2015   Hypothyroidism 08/03/2015   Osteoarthritis of right knee 02/24/2014   Allergic rhinitis 08/25/2011   Hypervitaminosis B6 06/26/2011    PCP: Willo Mini, NP REFERRING PROVIDER: Curtis Debby PARAS, MD  REFERRING DIAG: M54.12 (ICD-10-CM) - Cervical radiculopathy  THERAPY DIAG:  Radiculopathy, cervical region  Muscle weakness (generalized)  Rationale for Evaluation and Treatment: Rehabilitation  ONSET DATE: 05/12/23  SUBJECTIVE:                                                                                                                                                                                                         SUBJECTIVE STATEMENT: It's alright. It really hurt a lot after dry needling, but then it got better. The tingling is still there, the pain comes and goes.  EVAL: Patient reports pain  on the Rt side of her mid/upper back that goes down the back of the Rt arm and to digits 4-5. The tingling typically can start in digits 4-5 and then spreads to the rest of her fingers and hand. She gets a tingling sensation down the arm too and can get a lot of pain in the back of the elbow. Last Saturday she felt a twinge in her mid back when she went to get in bed and she went to  yoga last Monday and that's when the pain began to shoot down the arm. She had had PT before for her neck due to trap pain/tightness >15 years ago. She saw Dr. ONEIDA on Thursday and was given prednisone  that gives short-term relief. 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 Standing feels better, but sitting and laying down causes more pain.  Hand dominance: Right  PERTINENT HISTORY:  Thyroid  surgery   PAIN:  Are you having pain? Yes: NPRS scale: 6/10 Pain location: Posterior RUE,mid/upper back Pain description: tingling,somebody holding it really tight.  Aggravating factors: sitting, laying, turning and bending neck  Relieving factors: prednisone , holding the arm overhead  PRECAUTIONS: None  WEIGHT BEARING RESTRICTIONS: No  FALLS:  Has patient fallen in last 6 months? No  OCCUPATION: recruiter, desk job  PATIENT GOALS: I would like you to fix my back and my arm.   NEXT MD VISIT: 06/28/23  OBJECTIVE:  Note: Objective measures were completed at Evaluation unless otherwise noted. DIAGNOSTIC FINDINGS:  X-ray pending   PATIENT SURVEYS:  FOTO 50% function to 65% predicted  COGNITION: Overall cognitive status: Within functional limits for tasks assessed  SENSATION: Light touch: WFL  POSTURE: rounded shoulders and forward head  PALPATION: Not assessed    CERVICAL ROM:  Active ROM A/PROM (deg) eval  Flexion 50 pain  Extension 43  Right lateral flexion 25  Left lateral flexion 25  Right rotation 55 pain   Left rotation 55   (Blank rows = not tested)  UPPER EXTREMITY ROM: Active  ROM Right eval Left eval  Shoulder flexion    Shoulder extension    Shoulder abduction    Shoulder adduction    Shoulder extension    Shoulder internal rotation    Shoulder external rotation    Elbow flexion    Elbow extension    Wrist flexion    Wrist extension    Wrist ulnar deviation    Wrist radial deviation    Wrist pronation    Wrist supination     (Blank rows = not tested)  UPPER EXTREMITY MMT: MMT Right eval Left eval Right 06/04/23 R 06/07/23 06/11/23 Right   Shoulder flexion       Shoulder extension       Shoulder abduction 5 5     Shoulder adduction       Shoulder extension       Shoulder internal rotation       Shoulder external rotation       Middle trapezius       Lower trapezius       Elbow flexion 5 5     Elbow extension 5 5     Wrist flexion 5 5     Wrist extension 4 5     Wrist ulnar deviation       Wrist radial deviation       Wrist pronation       Finger abduction  3+ 4+     Grip strength 15 45 20 13 20    (Blank rows = not tested)  CERVICAL SPECIAL TESTS:  Spurling's (+) Cervical Distraction (+)  Hoffman's (-)   FUNCTIONAL TESTS:  Not tested    [facility] Adult PT Treatment:  DATE: 06/11/23 Therapeutic Exercise: Seated thoracic extension x 10  Ulnar nerve glides x 5 Open book Rt x 10  Cervical extension SNAG x 10  Updated HEP  Manual Therapy: STM Rt periscapular musculature CPAs grade II-V mid/lower thoracic Rt ulnar nerve glides x 10 Rt Median and radial nerve glides x 5 each   Modalities: MHP to cervical/thoracic region in sitting x 10 minutes    [facility] Adult PT Treatment:                                                DATE: 06/07/23  Therapeutic Exercise: Seated Tray stretch x 10 Seated scapular retraction x 10 On elbows performing scapular protraction/retraction - held retraction as it increases pain Chin tuck against gravity on elbows x 10 reps  Prayer nerve glide x 10 Quadriped -   T 2 x 10 1# to 90 deg B Thread the needle to [facility] rotation - painful MANUAL: Skilled palpation and monitoring of soft tissues during DN. STM to R UT, LS, Rhomboids and paraspinals Trigger Point Dry Needling  Subsequent Treatment: Instructions provided previously at initial dry needling treatment.  Instructions reviewed, if requested by the patient, prior to subsequent dry needling treatment.   Patient Verbal Consent Given: Yes Education Handout Provided: Previously Provided Muscles Treated: R UT, levator, rhomboids, cervical multifidi Electrical Stimulation Performed: No Treatment Response/Outcome: LTR and elongation of muscles treated  [facility] Adult PT Treatment:                                                DATE: 06/04/23 Therapeutic Exercise: Standing  Bicep curls 5# holding weight in R UE x 12 reps Bent over single arm rows with L knee on mat table and rowing with R UE x 5# x 15 reps Seated Wrist extension 1# x 12 reps Wrist pronation/supination 1# x 12 reps R UE Prone On elbows performing scapular protraction retraction Chin tuck against gravity on elbows x 10 reps with increased pain Supine Foam roller over thoracic spine-- with arms overhead to tolerance moving to 2 different locations perpendicular to spine for thoracic opening Manual Therapy: STM R thoracic multifidi  STM R infraspinatus, rhomboids Trigger Point Dry-Needling  Treatment instructions: Expect mild to moderate muscle soreness. Reviewed prior signs/symptoms of pneumothorax and discussed precautions taken when DN over lung field today (bony backdrop of vertebrae and needling parallel to ribs for rhomboid. Patient verbalized understanding of these instructions and education. Patient Consent Given: Yes Education handout provided: Previously provided Muscles treated: rhomboid, thoracic multifidi Treatment response/outcome: palpable lengthening Modalities: Heat pack thoracic spine during c-spine traction Cervical  traction x 12 minutes 18 max, 8 min 3 step progression    PATIENT EDUCATION:  Education details:HEP update  Person educated: Patient Education method: Explanation, Demonstration, Tactile cues, Verbal cues, and Handouts Education comprehension: verbalized understanding, returned demonstration, verbal cues required, tactile cues required, and needs further education  HOME EXERCISE PROGRAM: Access Code: [email or web address] URL: [email or web address] Date: 06/11/2023 Prepared by: Lucie Meeter  Exercises - Seated Cervical Retraction and Extension  - 6 x daily - 7 x weekly - 1 sets - 10 reps - Putty Squeezes  - 2 x daily - 7 x weekly - 1 sets - 10  reps - Wrist Extension with Resistance  - 2 x daily - 7 x weekly - 2 sets - 10 reps - Prone Scapular Slide with Shoulder Extension  - 2 x daily - 7 x weekly - 1 sets - 10 reps - Seated Thoracic Lumbar Extension with Pectoralis Stretch  - 1 x daily - 7 x weekly - 1 sets - 10 reps - Standing Ulnar Nerve Glide  - 1 x daily - 7 x weekly - 1 sets - 10 reps - Mid-Lower Cervical Extension SNAG with Strap  - 1 x daily - 7 x weekly - 1 sets - 10 reps - Sidelying Thoracic Rotation with Open Book  - 1 x daily - 7 x weekly - 1 sets - 10 reps  ASSESSMENT:  CLINICAL IMPRESSION: Armilda continues to report radicular symptoms about the RUE. Grip strength has improved compared to previous measurements, though weakness remains. She has pain and hypomobility about T3-T6, so focused on improving spinal mobility today. Following mid-thoracic manipulation she had no pain with PAIVM at T5,mild tenderness reported at T4 and T6, no change in pain at T3, and resolution of RUE pain (no change in digits 4-5 paresthesia). Ulnar nerve glides were positive, so added this to HEP in addition to cervical/thoracic mobility exercises that were completed in session today.   EVAL: Patient is a 63 y.o. female who was seen today for physical therapy evaluation and treatment for  cervical radiculopathy. Her pain began last Saturday when she went to lay down in bed and felt immediate pain in her upper/mid back. She then began experiencing radicular pain about the RUE during yoga last Monday. She has positive radicular special testing and associated wrist extensor, grip and finger abduction weakness. She appears to respond positively to repeated movement, so this was issued as part of HEP in addition to grip strengthening. She will benefit from skilled PT to address the above stated deficits in order to optimize her function and assist in overall pain reduction.   OBJECTIVE IMPAIRMENTS: decreased activity tolerance, decreased knowledge of condition, decreased ROM, decreased strength, impaired sensation, impaired UE functional use, postural dysfunction, and pain.   GOALS: Goals reviewed with patient? Yes  SHORT TERM GOALS: Target date: 06/11/2023  Patient will be independent and compliant with initial HEP.  Baseline: issued at eval Goal status: MET  2.  Patient will report no symptoms distal to the elbow indicative of improvement in her current condition.  Baseline: paraesthesia mostly digits 4-5   Goal status: ONGOING continued parasthesia, tight gripping pain is better in lower arm   3.  Patient will demonstrate at least 25 lbs of Rt grip strength to improve ability to carry items.  Baseline: see above  Goal status: ONGOING  LONG TERM GOALS: Target date: 07/07/23  Patient will score at least 65% on FOTO to signify clinically meaningful improvement in functional abilities.  Baseline: 50 Goal status: INITIAL  2.  Patient will demonstrate functional and pain free cervical AROM to improve ability to complete desk work.  Baseline: see above  Goal status: INITIAL  3.  Patient will demonstrate 5/5 Rt wrist extensor strength to improve ability to lift/carry items.  Baseline: see above  Goal status: INITIAL  4.  Patient will report pain at worst rated as </= 2/10 to  reduce current functional limitations.  Baseline: 9 Goal status: INITIAL  PLAN:  PT FREQUENCY: 2x/week  PT DURATION: 6 weeks  PLANNED INTERVENTIONS: 97164- PT Re-evaluation, 97110-Therapeutic exercises, 97530- Therapeutic activity, [id number]- Neuromuscular  re-education, [phone]- Self Care, [id number]- Manual therapy, [id number]- Aquatic Therapy, 97014- Electrical stimulation (unattended), [id number]- Electrical stimulation (manual), [id number]- Traction (mechanical), Taping, Dry Needling, Cryotherapy, and Moist heat  PLAN FOR NEXT SESSION: Review and progress HEP prn;  wrist extensor strengthening. Postural strengthening. Manipulation response (could consider upper thoracic/lower cervical manipulation), consider sustained traction; ulnar nerve glides   Lucie Meeter, PT, DPT, ATC 06/11/23 8:29 AM

## 2023-06-15 ENCOUNTER — Ambulatory Visit: Payer: BC Managed Care – PPO | Admitting: Rehabilitative and Restorative Service Providers"

## 2023-06-15 ENCOUNTER — Encounter: Payer: Self-pay | Admitting: Rehabilitative and Restorative Service Providers"

## 2023-06-15 DIAGNOSIS — M5412 Radiculopathy, cervical region: Secondary | ICD-10-CM | POA: Diagnosis not present

## 2023-06-15 DIAGNOSIS — M6281 Muscle weakness (generalized): Secondary | ICD-10-CM

## 2023-06-15 NOTE — Therapy (Signed)
 OUTPATIENT PHYSICAL THERAPY CERVICAL TREATMENT   Patient Name: Kristina Kelley MRN: [id number] DOB:May 01, 1961, 63 y.o., female Today's Date: 06/15/2023  END OF SESSION:  PT End of Session - 06/15/23 0854     Visit Number 7    Number of Visits 13    Date for PT Re-Evaluation 07/07/23    Authorization Type BCBS    PT Start Time 0846    PT Stop Time 0930    PT Time Calculation (min) 44 min    Activity Tolerance Patient tolerated treatment well    Behavior During Therapy [facility] for tasks assessed/performed             Past Medical History:  Diagnosis Date   Asthma    Hypertension    Thyroid  disease    Past Surgical History:  Procedure Laterality Date   ABDOMINAL HYSTERECTOMY     CESAREAN SECTION     THYROID  SURGERY     Patient Active Problem List   Diagnosis Date Noted   Asthma 03/21/2022   Gastro-esophageal reflux disease without esophagitis 03/21/2022   Hypertension 03/21/2022   Moderate persistent asthma 03/21/2022   Malignant melanoma of torso excluding breast (HCC) 06/08/2020   Cervical radiculopathy 09/12/2017   Basal cell carcinoma 08/03/2015   Hypothyroidism 08/03/2015   Osteoarthritis of right knee 02/24/2014   Allergic rhinitis 08/25/2011   Hypervitaminosis B6 06/26/2011    PCP: Willo Mini, NP REFERRING PROVIDER: Curtis Debby PARAS, MD  REFERRING DIAG: M54.12 (ICD-10-CM) - Cervical radiculopathy  THERAPY DIAG:  Radiculopathy, cervical region  Muscle weakness (generalized)  Rationale for Evaluation and Treatment: Rehabilitation  ONSET DATE: 05/12/23  SUBJECTIVE:                                                                                                                                                                                                         SUBJECTIVE STATEMENT: The patient notes she felt improvement in arm pain after last session. She went to a dance aerobics class last night-- she couldn't do all of the movements, but wanted to  move.   EVAL: Patient reports pain on the Rt side of her mid/upper back that goes down the back of the Rt arm and to digits 4-5. The tingling typically can start in digits 4-5 and then spreads to the rest of her fingers and hand. She gets a tingling sensation down the arm too and can get a lot of pain in the back of the elbow. Last Saturday she felt a twinge in her mid back when she went to get in bed  and she went to yoga last Monday and that's when the pain began to shoot down the arm. She had had PT before for her neck due to trap pain/tightness >15 years ago. She saw Dr. ONEIDA on Thursday and was given prednisone  that gives short-term relief. 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 Standing feels better, but sitting and laying down causes more pain.  Hand dominance: Right  PERTINENT HISTORY:  Thyroid  surgery   PAIN:  Are you having pain? Yes: NPRS scale: 6/10 Pain location: Posterior RUE,mid/upper back Pain description: tingling,somebody holding it really tight.  Aggravating factors: sitting, laying, turning and bending neck  Relieving factors: prednisone , holding the arm overhead  PRECAUTIONS: None  WEIGHT BEARING RESTRICTIONS: No  FALLS:  Has patient fallen in last 6 months? No  OCCUPATION: recruiter, desk job  PATIENT GOALS: I would like you to fix my back and my arm.   NEXT MD VISIT: 06/28/23  OBJECTIVE:  Note: Objective measures were completed at Evaluation unless otherwise noted. DIAGNOSTIC FINDINGS:  X-ray pending   PATIENT SURVEYS:  FOTO 50% function to 65% predicted  COGNITION: Overall cognitive status: Within functional limits for tasks assessed  SENSATION: Light touch: WFL  POSTURE: rounded shoulders and forward head  PALPATION: Not assessed    CERVICAL ROM:  Active ROM A/PROM (deg) eval  Flexion 50 pain  Extension 43  Right lateral flexion 25  Left lateral flexion 25  Right rotation 55 pain   Left rotation 55   (Blank rows = not  tested)  UPPER EXTREMITY ROM: Active ROM Right eval Left eval  Shoulder flexion    Shoulder extension    Shoulder abduction    Shoulder adduction    Shoulder extension    Shoulder internal rotation    Shoulder external rotation    Elbow flexion    Elbow extension    Wrist flexion    Wrist extension    Wrist ulnar deviation    Wrist radial deviation    Wrist pronation    Wrist supination     (Blank rows = not tested)  UPPER EXTREMITY MMT: MMT Right eval Left eval Right 06/04/23 R 06/07/23 06/11/23 Right   Shoulder flexion       Shoulder extension       Shoulder abduction 5 5     Shoulder adduction       Shoulder extension       Shoulder internal rotation       Shoulder external rotation       Middle trapezius       Lower trapezius       Elbow flexion 5 5     Elbow extension 5 5     Wrist flexion 5 5     Wrist extension 4 5     Wrist ulnar deviation       Wrist radial deviation       Wrist pronation       Finger abduction  3+ 4+     Grip strength 15 45 20 13 20    (Blank rows = not tested)  CERVICAL SPECIAL TESTS:  Spurling's (+) Cervical Distraction (+)  Hoffman's (-)   FUNCTIONAL TESTS:  Not tested   [facility] Adult PT Treatment:  DATE: 06/15/23 Therapeutic Exercise: Quadriped Cat/cow for spinal mobility x 10 reps Thread the needle x 8 reps each side Prone on elbows Thoracic mobilization/rotation x 10 reps Supine Trunk rotation with arms in t for chest opening Towel mobilization thoracic spine  Manual Therapy: PA mobilization with hypomobility at C2-C3 in supine position, upglides, down glides grade II-III PA mobilization thoracic spine with hypomobility T1-T6--- most discomfort around T4 region grade II-III STM cervical spine paraspinals Modalities: Cervical traction sustained x 18 lbs x 12 minutes with head at thoracic spine during traction (did not feel 15-16 lbs-- so tolerated more with sustained  pressure)   [facility] Adult PT Treatment:                                                DATE: 06/11/23 Therapeutic Exercise: Seated thoracic extension x 10  Ulnar nerve glides x 5 Open book Rt x 10  Cervical extension SNAG x 10  Updated HEP  Manual Therapy: STM Rt periscapular musculature CPAs grade II-V mid/lower thoracic Rt ulnar nerve glides x 10 Rt Median and radial nerve glides x 5 each   Modalities: MHP to cervical/thoracic region in sitting x 10 minutes    [facility] Adult PT Treatment:                                                DATE: 06/07/23  Therapeutic Exercise: Seated Tray stretch x 10 Seated scapular retraction x 10 On elbows performing scapular protraction/retraction - held retraction as it increases pain Chin tuck against gravity on elbows x 10 reps  Prayer nerve glide x 10 Quadriped -  T 2 x 10 1# to 90 deg B Thread the needle to [facility] rotation - painful MANUAL: Skilled palpation and monitoring of soft tissues during DN. STM to R UT, LS, Rhomboids and paraspinals Trigger Point Dry Needling Subsequent Treatment: Instructions provided previously at initial dry needling treatment.  Instructions reviewed, if requested by the patient, prior to subsequent dry needling treatment.  Patient Verbal Consent Given: Yes Education Handout Provided: Previously Provided Muscles Treated: R UT, levator, rhomboids, cervical multifidi Electrical Stimulation Performed: No Treatment Response/Outcome: LTR and elongation of muscles treated   PATIENT EDUCATION:  Education details:HEP update  Person educated: Patient Education method: Explanation, Demonstration, Tactile cues, Verbal cues, and Handouts Education comprehension: verbalized understanding, returned demonstration, verbal cues required, tactile cues required, and needs further education  HOME EXERCISE PROGRAM: Access Code: [email or web address] URL: [email or web address] Date: 06/11/2023 Prepared by: Lucie Meeter  Exercises - Seated Cervical Retraction and Extension  - 6 x daily - 7 x weekly - 1 sets - 10 reps - Putty Squeezes  - 2 x daily - 7 x weekly - 1 sets - 10 reps - Wrist Extension with Resistance  - 2 x daily - 7 x weekly - 2 sets - 10 reps - Prone Scapular Slide with Shoulder Extension  - 2 x daily - 7 x weekly - 1 sets - 10 reps - Seated Thoracic Lumbar Extension with Pectoralis Stretch  - 1 x daily - 7 x weekly - 1 sets - 10 reps - Standing Ulnar Nerve Glide  - 1 x daily - 7 x weekly - 1 sets - 10 reps -  Mid-Lower Cervical Extension SNAG with Strap  - 1 x daily - 7 x weekly - 1 sets - 10 reps - Sidelying Thoracic Rotation with Open Book  - 1 x daily - 7 x weekly - 1 sets - 10 reps  ASSESSMENT:  CLINICAL IMPRESSION: The patient notes continued improvement. She has increased movement and feels less stiff after self mobilization and AROM activities focusing on mobilization through upper back/neck. PT changed to sustained traction to determine response. Plan to continue current HEP as they address neck, thoracic spine, hand weakness. PT slowly working towards gentle yoga as this was prior activity. The thoracic spine appears to be symptom generator, however with soft tissue work, patient notes tightness in scalenes and cervical paraspinals.  EVAL: Patient is a 63 y.o. female who was seen today for physical therapy evaluation and treatment for cervical radiculopathy. Her pain began last Saturday when she went to lay down in bed and felt immediate pain in her upper/mid back. She then began experiencing radicular pain about the RUE during yoga last Monday. She has positive radicular special testing and associated wrist extensor, grip and finger abduction weakness. She appears to respond positively to repeated movement, so this was issued as part of HEP in addition to grip strengthening. She will benefit from skilled PT to address the above stated deficits in order to optimize her function and assist  in overall pain reduction.   OBJECTIVE IMPAIRMENTS: decreased activity tolerance, decreased knowledge of condition, decreased ROM, decreased strength, impaired sensation, impaired UE functional use, postural dysfunction, and pain.   GOALS: Goals reviewed with patient? Yes  SHORT TERM GOALS: Target date: 06/11/2023  Patient will be independent and compliant with initial HEP.  Baseline: issued at eval Goal status: MET  2.  Patient will report no symptoms distal to the elbow indicative of improvement in her current condition.  Baseline: paraesthesia mostly digits 4-5   Goal status: ONGOING continued parasthesia, tight gripping pain is better in lower arm   3.  Patient will demonstrate at least 25 lbs of Rt grip strength to improve ability to carry items.  Baseline: see above  Goal status: ONGOING  LONG TERM GOALS: Target date: 07/07/23  Patient will score at least 65% on FOTO to signify clinically meaningful improvement in functional abilities.  Baseline: 50 Goal status: INITIAL  2.  Patient will demonstrate functional and pain free cervical AROM to improve ability to complete desk work.  Baseline: see above  Goal status: INITIAL  3.  Patient will demonstrate 5/5 Rt wrist extensor strength to improve ability to lift/carry items.  Baseline: see above  Goal status: INITIAL  4.  Patient will report pain at worst rated as </= 2/10 to reduce current functional limitations.  Baseline: 9 Goal status: INITIAL  PLAN:  PT FREQUENCY: 2x/week  PT DURATION: 6 weeks  PLANNED INTERVENTIONS: 97164- PT Re-evaluation, 97110-Therapeutic exercises, 97530- Therapeutic activity, [id number]- Neuromuscular re-education, 97535- Self Care, [id number]- Manual therapy, [id number]- Aquatic Therapy, 97014- Electrical stimulation (unattended), [id number]- Electrical stimulation (manual), [id number]- Traction (mechanical), Taping, Dry Needling, Cryotherapy, and Moist heat  PLAN FOR NEXT SESSION: Review and progress HEP prn;  wrist  extensor strengthening. Postural strengthening. Manipulation response (could consider upper thoracic/lower cervical manipulation), consider sustained traction; ulnar nerve glides    Derricka Mertz, PT 06/15/23 8:55 AM

## 2023-06-18 ENCOUNTER — Ambulatory Visit: Payer: BC Managed Care – PPO

## 2023-06-18 DIAGNOSIS — M5412 Radiculopathy, cervical region: Secondary | ICD-10-CM | POA: Diagnosis not present

## 2023-06-18 DIAGNOSIS — M6281 Muscle weakness (generalized): Secondary | ICD-10-CM

## 2023-06-18 NOTE — Therapy (Signed)
 OUTPATIENT PHYSICAL THERAPY CERVICAL TREATMENT   Patient Name: Kristina Kelley MRN: [id number] DOB:Aug 09, 1960, 63 y.o., female Today's Date: 06/18/2023  END OF SESSION:  PT End of Session - 06/18/23 0713     Visit Number 8    Number of Visits 13    Date for PT Re-Evaluation 07/07/23    Authorization Type BCBS    PT Start Time 0715    PT Stop Time 0800    PT Time Calculation (min) 45 min    Activity Tolerance Patient tolerated treatment well    Behavior During Therapy [facility] for tasks assessed/performed              Past Medical History:  Diagnosis Date   Asthma    Hypertension    Thyroid  disease    Past Surgical History:  Procedure Laterality Date   ABDOMINAL HYSTERECTOMY     CESAREAN SECTION     THYROID  SURGERY     Patient Active Problem List   Diagnosis Date Noted   Asthma 03/21/2022   Gastro-esophageal reflux disease without esophagitis 03/21/2022   Hypertension 03/21/2022   Moderate persistent asthma 03/21/2022   Malignant melanoma of torso excluding breast (HCC) 06/08/2020   Cervical radiculopathy 09/12/2017   Basal cell carcinoma 08/03/2015   Hypothyroidism 08/03/2015   Osteoarthritis of right knee 02/24/2014   Allergic rhinitis 08/25/2011   Hypervitaminosis B6 06/26/2011    PCP: Willo Mini, NP REFERRING PROVIDER: Curtis Debby PARAS, MD  REFERRING DIAG: M54.12 (ICD-10-CM) - Cervical radiculopathy  THERAPY DIAG:  Radiculopathy, cervical region  Muscle weakness (generalized)  Rationale for Evaluation and Treatment: Rehabilitation  ONSET DATE: 05/12/23  SUBJECTIVE:                                                                                                                                                                                                         SUBJECTIVE STATEMENT: Mild tingling in these two fingers (4-5). Not really anything in the elbow. Still feel it on that same spot in the back.   EVAL: Patient reports pain on the Rt side  of her mid/upper back that goes down the back of the Rt arm and to digits 4-5. The tingling typically can start in digits 4-5 and then spreads to the rest of her fingers and hand. She gets a tingling sensation down the arm too and can get a lot of pain in the back of the elbow. Last Saturday she felt a twinge in her mid back when she went to get in bed and she went to yoga last Monday and  that's when the pain began to shoot down the arm. She had had PT before for her neck due to trap pain/tightness >15 years ago. She saw Dr. ONEIDA on Thursday and was given prednisone  that gives short-term relief. 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 Standing feels better, but sitting and laying down causes more pain.  Hand dominance: Right  PERTINENT HISTORY:  Thyroid  surgery   PAIN:  Are you having pain? Yes: NPRS scale: 5/10 Pain location: mid/upper back Pain description: annoying Aggravating factors: sitting, laying, turning and bending neck  Relieving factors: prednisone , holding the arm overhead  PRECAUTIONS: None  WEIGHT BEARING RESTRICTIONS: No  FALLS:  Has patient fallen in last 6 months? No  OCCUPATION: recruiter, desk job  PATIENT GOALS: I would like you to fix my back and my arm.   NEXT MD VISIT: 06/28/23  OBJECTIVE:  Note: Objective measures were completed at Evaluation unless otherwise noted. DIAGNOSTIC FINDINGS:  X-ray pending   PATIENT SURVEYS:  FOTO 50% function to 65% predicted 06/18/23: 54% function  COGNITION: Overall cognitive status: Within functional limits for tasks assessed  SENSATION: Light touch: WFL  POSTURE: rounded shoulders and forward head  PALPATION: Not assessed    CERVICAL ROM:  Active ROM A/PROM (deg) eval 06/18/23  Flexion 50 pain 60 tight   Extension 43 60  Right lateral flexion 25   Left lateral flexion 25   Right rotation 55 pain  63; 80 post manual   Left rotation 55 55; 65 post manual    (Blank rows = not tested)  UPPER  EXTREMITY ROM: Active ROM Right eval Left eval  Shoulder flexion    Shoulder extension    Shoulder abduction    Shoulder adduction    Shoulder extension    Shoulder internal rotation    Shoulder external rotation    Elbow flexion    Elbow extension    Wrist flexion    Wrist extension    Wrist ulnar deviation    Wrist radial deviation    Wrist pronation    Wrist supination     (Blank rows = not tested)  UPPER EXTREMITY MMT: MMT Right eval Left eval Right 06/04/23 R 06/07/23 06/11/23 Right   Shoulder flexion       Shoulder extension       Shoulder abduction 5 5     Shoulder adduction       Shoulder extension       Shoulder internal rotation       Shoulder external rotation       Middle trapezius       Lower trapezius       Elbow flexion 5 5     Elbow extension 5 5     Wrist flexion 5 5     Wrist extension 4 5     Wrist ulnar deviation       Wrist radial deviation       Wrist pronation       Finger abduction  3+ 4+     Grip strength 15 45 20 13 20    (Blank rows = not tested)  CERVICAL SPECIAL TESTS:  Spurling's (+) Cervical Distraction (+)  Hoffman's (-)   FUNCTIONAL TESTS:  Not tested  [facility] Adult PT Treatment:  DATE: 06/18/23 Therapeutic Exercise: Thoracic extension on 1/2 foam roller x 10  Cow/cow to childs pose x 10  Puppy pose x 30 sec Manual Therapy: Mid/lower T-spine CPAs grade IV-V with cavitation J Thrust cervicothoracic manipulation   STM cervicothoracic paraspinals, rhomboids   Modalities: Mechanical cervical traction sustained x 10 minutes, 2 step progression and regression max 18 lbs, min 8 lbs.    [facility] Adult PT Treatment:                                                DATE: 06/15/23 Therapeutic Exercise: Quadriped Cat/cow for spinal mobility x 10 reps Thread the needle x 8 reps each side Prone on elbows Thoracic mobilization/rotation x 10 reps Supine Trunk rotation with arms in t for chest  opening Towel mobilization thoracic spine  Manual Therapy: PA mobilization with hypomobility at C2-C3 in supine position, upglides, down glides grade II-III PA mobilization thoracic spine with hypomobility T1-T6--- most discomfort around T4 region grade II-III STM cervical spine paraspinals Modalities: Cervical traction sustained x 18 lbs x 12 minutes with head at thoracic spine during traction (did not feel 15-16 lbs-- so tolerated more with sustained pressure)   [facility] Adult PT Treatment:                                                DATE: 06/11/23 Therapeutic Exercise: Seated thoracic extension x 10  Ulnar nerve glides x 5 Open book Rt x 10  Cervical extension SNAG x 10  Updated HEP  Manual Therapy: STM Rt periscapular musculature CPAs grade II-V mid/lower thoracic Rt ulnar nerve glides x 10 Rt Median and radial nerve glides x 5 each   Modalities: MHP to cervical/thoracic region in sitting x 10 minutes    [facility] Adult PT Treatment:                                                DATE: 06/07/23  Therapeutic Exercise: Seated Tray stretch x 10 Seated scapular retraction x 10 On elbows performing scapular protraction/retraction - held retraction as it increases pain Chin tuck against gravity on elbows x 10 reps  Prayer nerve glide x 10 Quadriped -  T 2 x 10 1# to 90 deg B Thread the needle to [facility] rotation - painful MANUAL: Skilled palpation and monitoring of soft tissues during DN. STM to R UT, LS, Rhomboids and paraspinals Trigger Point Dry Needling Subsequent Treatment: Instructions provided previously at initial dry needling treatment.  Instructions reviewed, if requested by the patient, prior to subsequent dry needling treatment.  Patient Verbal Consent Given: Yes Education Handout Provided: Previously Provided Muscles Treated: R UT, levator, rhomboids, cervical multifidi Electrical Stimulation Performed: No Treatment Response/Outcome: LTR and elongation of muscles  treated   PATIENT EDUCATION:  Education details:HEP review  Person educated: Patient Education method: Explanation Education comprehension: verbalized understanding  HOME EXERCISE PROGRAM: Access Code: [email or web address] URL: [email or web address] Date: 06/11/2023 Prepared by: Lucie Meeter  Exercises - Seated Cervical Retraction and Extension  - 6 x daily - 7 x weekly - 1 sets - 10 reps - Putty Squeezes  -  2 x daily - 7 x weekly - 1 sets - 10 reps - Wrist Extension with Resistance  - 2 x daily - 7 x weekly - 2 sets - 10 reps - Prone Scapular Slide with Shoulder Extension  - 2 x daily - 7 x weekly - 1 sets - 10 reps - Seated Thoracic Lumbar Extension with Pectoralis Stretch  - 1 x daily - 7 x weekly - 1 sets - 10 reps - Standing Ulnar Nerve Glide  - 1 x daily - 7 x weekly - 1 sets - 10 reps - Mid-Lower Cervical Extension SNAG with Strap  - 1 x daily - 7 x weekly - 1 sets - 10 reps - Sidelying Thoracic Rotation with Open Book  - 1 x daily - 7 x weekly - 1 sets - 10 reps  ASSESSMENT:  CLINICAL IMPRESSION: C4 PAIVM created tingling down the posterior RUE and C5-6 PAIVM caused pain down her spine. Following manipulation PAIVM of cervical/thoracic spine did not create tingling sensation about RUE, but did continue to experience radiating pain down her spine at C5 level. Cervical rotation AROM significantly improved following manipulation as well.  Continued with spinal mobility with good tolerance. Finished with sustained traction as patient found this helpful in reducing pain/paraesthesia following last session.   EVAL: Patient is a 63 y.o. female who was seen today for physical therapy evaluation and treatment for cervical radiculopathy. Her pain began last Saturday when she went to lay down in bed and felt immediate pain in her upper/mid back. She then began experiencing radicular pain about the RUE during yoga last Monday. She has positive radicular special testing and associated  wrist extensor, grip and finger abduction weakness. She appears to respond positively to repeated movement, so this was issued as part of HEP in addition to grip strengthening. She will benefit from skilled PT to address the above stated deficits in order to optimize her function and assist in overall pain reduction.   OBJECTIVE IMPAIRMENTS: decreased activity tolerance, decreased knowledge of condition, decreased ROM, decreased strength, impaired sensation, impaired UE functional use, postural dysfunction, and pain.   GOALS: Goals reviewed with patient? Yes  SHORT TERM GOALS: Target date: 06/11/2023  Patient will be independent and compliant with initial HEP.  Baseline: issued at eval Goal status: MET  2.  Patient will report no symptoms distal to the elbow indicative of improvement in her current condition.  Baseline: paraesthesia mostly digits 4-5   Goal status: ONGOING continued parasthesia, tight gripping pain is better in lower arm   3.  Patient will demonstrate at least 25 lbs of Rt grip strength to improve ability to carry items.  Baseline: see above  Goal status: ONGOING  LONG TERM GOALS: Target date: 07/07/23  Patient will score at least 65% on FOTO to signify clinically meaningful improvement in functional abilities.  Baseline: 50 Goal status: INITIAL  2.  Patient will demonstrate functional and pain free cervical AROM to improve ability to complete desk work.  Baseline: see above  Goal status: INITIAL  3.  Patient will demonstrate 5/5 Rt wrist extensor strength to improve ability to lift/carry items.  Baseline: see above  Goal status: INITIAL  4.  Patient will report pain at worst rated as </= 2/10 to reduce current functional limitations.  Baseline: 9 Goal status: INITIAL  PLAN:  PT FREQUENCY: 2x/week  PT DURATION: 6 weeks  PLANNED INTERVENTIONS: 97164- PT Re-evaluation, 97110-Therapeutic exercises, 97530- Therapeutic activity, [id number]- Neuromuscular re-education,  97535- Self Care,  02859- Manual therapy, [id number]- Aquatic Therapy, [id number]- Electrical stimulation (unattended), [id number]- Electrical stimulation (manual), 02987- Traction (mechanical), Taping, Dry Needling, Cryotherapy, and Moist heat  PLAN FOR NEXT SESSION: Review and progress HEP prn;  wrist extensor strengthening. Postural strengthening. Manipulation response. Sustained traction; spinal mobility. ulnar nerve glides   Lucie Meeter, PT, DPT, ATC 06/18/23 7:51 AM

## 2023-06-20 ENCOUNTER — Encounter (INDEPENDENT_AMBULATORY_CARE_PROVIDER_SITE_OTHER): Payer: BC Managed Care – PPO | Admitting: Sports Medicine

## 2023-06-20 DIAGNOSIS — M5412 Radiculopathy, cervical region: Secondary | ICD-10-CM | POA: Diagnosis not present

## 2023-06-21 NOTE — Telephone Encounter (Signed)

## 2023-06-22 ENCOUNTER — Encounter: Payer: Self-pay | Admitting: Rehabilitative and Restorative Service Providers"

## 2023-06-22 ENCOUNTER — Ambulatory Visit: Payer: BC Managed Care – PPO | Admitting: Rehabilitative and Restorative Service Providers"

## 2023-06-22 DIAGNOSIS — M6281 Muscle weakness (generalized): Secondary | ICD-10-CM

## 2023-06-22 DIAGNOSIS — M5412 Radiculopathy, cervical region: Secondary | ICD-10-CM | POA: Diagnosis not present

## 2023-06-22 NOTE — Therapy (Signed)
OUTPATIENT PHYSICAL THERAPY CERVICAL TREATMENT   Patient Name: Kristina Kelley MRN: [id number] DOB:1960-11-30, 63 y.o., female Today's Date: 06/22/2023  END OF SESSION:  PT End of Session - 06/22/23 0934     Visit Number 9    Number of Visits 13    Date for PT Re-Evaluation 07/07/23    Authorization Type BCBS    PT Start Time 0934    PT Stop Time 1016    PT Time Calculation (min) 42 min    Activity Tolerance Patient tolerated treatment well    Behavior During Therapy [facility] for tasks assessed/performed             Past Medical History:  Diagnosis Date   Asthma    Hypertension    Thyroid disease    Past Surgical History:  Procedure Laterality Date   ABDOMINAL HYSTERECTOMY     CESAREAN SECTION     THYROID SURGERY     Patient Active Problem List   Diagnosis Date Noted   Asthma 03/21/2022   Gastro-esophageal reflux disease without esophagitis 03/21/2022   Hypertension 03/21/2022   Moderate persistent asthma 03/21/2022   Malignant melanoma of torso excluding breast (HCC) 06/08/2020   Cervical radiculopathy 09/12/2017   Basal cell carcinoma 08/03/2015   Hypothyroidism 08/03/2015   Osteoarthritis of right knee 02/24/2014   Allergic rhinitis 08/25/2011   Hypervitaminosis B6 06/26/2011    PCP: Christen Butter, NP REFERRING PROVIDER: Monica Becton, MD  REFERRING DIAG: M54.12 (ICD-10-CM) - Cervical radiculopathy  THERAPY DIAG:  Radiculopathy, cervical region  Muscle weakness (generalized)  Rationale for Evaluation and Treatment: Rehabilitation  ONSET DATE: 05/12/23  SUBJECTIVE:                                                                                                                                                                                                         SUBJECTIVE STATEMENT: The patient had a lot of computer work this week and feels increased pain in lateral elbow and 4th and 5th digits. She continues with stiffness in the mid back "it's still  in the same spot".   EVAL: Patient reports pain on the Rt side of her mid/upper back that goes down the back of the Rt arm and to digits 4-5. The tingling typically can start in digits 4-5 and then spreads to the rest of her fingers and hand. She gets a tingling sensation down the arm too and can get a lot of pain in the back of the elbow. Last Saturday she felt a twinge in her mid back when she went  to get in bed and she went to yoga last Monday and that's when the pain began to shoot down the arm. She had had PT before for her neck due to trap pain/tightness >15 years ago. She saw Dr. Karie Schwalbe on Thursday and was given prednisone that gives short-term relief. 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 Standing feels better, but sitting and laying down causes more pain.  Hand dominance: Right  PERTINENT HISTORY:  Thyroid surgery   PAIN:  Are you having pain? Yes: NPRS scale: 8-9/10 today-- she notes she has a lot of computer work ahead of her this weekend. /10 Pain location: mid/upper back Pain description: annoying Aggravating factors: sitting, laying, turning and bending neck  Relieving factors: prednisone, holding the arm overhead  PRECAUTIONS: None  WEIGHT BEARING RESTRICTIONS: No  FALLS:  Has patient fallen in last 6 months? No  OCCUPATION: recruiter, desk job  PATIENT GOALS: "I would like you to fix my back and my arm."   NEXT MD VISIT: 06/28/23  OBJECTIVE:  Note: Objective measures were completed at Evaluation unless otherwise noted. DIAGNOSTIC FINDINGS:  X-ray pending   PATIENT SURVEYS:  FOTO 50% function to 65% predicted 06/18/23: 54% function  COGNITION: Overall cognitive status: Within functional limits for tasks assessed  SENSATION: Light touch: WFL  POSTURE: rounded shoulders and forward head  PALPATION: Not assessed    CERVICAL ROM:  Active ROM A/PROM (deg) eval 06/18/23  Flexion 50 pain 60 tight   Extension 43 60  Right lateral flexion 25   Left  lateral flexion 25   Right rotation 55 pain  63; 80 post manual   Left rotation 55 55; 65 post manual    (Blank rows = not tested)  UPPER EXTREMITY ROM: Active ROM Right eval Left eval  Shoulder flexion    Shoulder extension    Shoulder abduction    Shoulder adduction    Shoulder extension    Shoulder internal rotation    Shoulder external rotation    Elbow flexion    Elbow extension    Wrist flexion    Wrist extension    Wrist ulnar deviation    Wrist radial deviation    Wrist pronation    Wrist supination     (Blank rows = not tested)  UPPER EXTREMITY MMT: MMT Right eval Left eval Right 06/04/23 R 06/07/23 06/11/23 Right   Shoulder flexion       Shoulder extension       Shoulder abduction 5 5     Shoulder adduction       Shoulder extension       Shoulder internal rotation       Shoulder external rotation       Middle trapezius       Lower trapezius       Elbow flexion 5 5     Elbow extension 5 5     Wrist flexion 5 5     Wrist extension 4 5     Wrist ulnar deviation       Wrist radial deviation       Wrist pronation       Finger abduction  3+ 4+     Grip strength 15 45 20 13 20    (Blank rows = not tested)  CERVICAL SPECIAL TESTS:  Spurling's (+) Cervical Distraction (+)  Hoffman's (-)   FUNCTIONAL TESTS:  Not tested   [facility] Adult PT Treatment:  DATE: 06/22/2023 Therapeutic Exercise: Prone On elbows trunk rotation opening R and L x 3 reps Scapular protraction/retraction x 5 reps On elbows with cervical rotation and flexion Standing Yoga egg with scapular retraction  Yoga egg with "W"s  Manual Therapy: Rotational mobilization with movement with lateral glide of mid t-spine Prone thoracic mobilization STM cervical and thoracic paraspinals Trigger Point Dry Needling Subsequent Treatment: Instructions reviewed, if requested by the patient, prior to subsequent dry needling treatment.  Patient Verbal  Consent Given: Yes Education Handout Provided: Previously Provided Muscles Treated: bilateral suboccipitals, R cervical paraspinals C6/C7, R T5 paraspinals using bony backdrop Treatment Response/Outcome: palpable lengthening *ended with hot pack  [facility] Adult PT Treatment:                                                DATE: 06/18/23 Therapeutic Exercise: Thoracic extension on 1/2 foam roller x 10  Cow/cow to childs pose x 10  Puppy pose x 30 sec Manual Therapy: Mid/lower T-spine CPAs grade IV-V with cavitation J Thrust cervicothoracic manipulation   STM cervicothoracic paraspinals, rhomboids   Modalities: Mechanical cervical traction sustained x 10 minutes, 2 step progression and regression max 18 lbs, min 8 lbs.    [facility] Adult PT Treatment:                                                DATE: 06/15/23 Therapeutic Exercise: Quadriped Cat/cow for spinal mobility x 10 reps Thread the needle x 8 reps each side Prone on elbows Thoracic mobilization/rotation x 10 reps Supine Trunk rotation with arms in "t" for chest opening Towel mobilization thoracic spine  Manual Therapy: PA mobilization with hypomobility at C2-C3 in supine position, upglides, down glides grade II-III PA mobilization thoracic spine with hypomobility T1-T6--- most discomfort around T4 region grade II-III STM cervical spine paraspinals Modalities: Cervical traction sustained x 18 lbs x 12 minutes with head at thoracic spine during traction (did not feel 15-16 lbs-- so tolerated more with sustained pressure)   PATIENT EDUCATION:  Education details:HEP review  Person educated: Patient Education method: Explanation Education comprehension: verbalized understanding  HOME EXERCISE PROGRAM: Access Code: [email or web address] URL: [email or web address] Date: 06/11/2023 Prepared by: Letitia Libra  Exercises - Seated Cervical Retraction and Extension  - 6 x daily - 7 x weekly - 1 sets - 10 reps - Putty Squeezes  -  2 x daily - 7 x weekly - 1 sets - 10 reps - Wrist Extension with Resistance  - 2 x daily - 7 x weekly - 2 sets - 10 reps - Prone Scapular Slide with Shoulder Extension  - 2 x daily - 7 x weekly - 1 sets - 10 reps - Seated Thoracic Lumbar Extension with Pectoralis Stretch  - 1 x daily - 7 x weekly - 1 sets - 10 reps - Standing Ulnar Nerve Glide  - 1 x daily - 7 x weekly - 1 sets - 10 reps - Mid-Lower Cervical Extension SNAG with Strap  - 1 x daily - 7 x weekly - 1 sets - 10 reps - Sidelying Thoracic Rotation with Open Book  - 1 x daily - 7 x weekly - 1 sets - 10 reps  ASSESSMENT:  CLINICAL IMPRESSION: The  patient is continuing with pain in R arm, mid back, and neck described as stiffness that is worse today. We have focused on self mobilization, and today used STM, Dry needling to reduce trigger points. PT to progress as pain allows. Plan to return to traction next visit-- held today due to irritable symptoms.  EVAL: Patient is a 63 y.o. female who was seen today for physical therapy evaluation and treatment for cervical radiculopathy. Her pain began last Saturday when she went to lay down in bed and felt immediate pain in her upper/mid back. She then began experiencing radicular pain about the RUE during yoga last Monday. She has positive radicular special testing and associated wrist extensor, grip and finger abduction weakness. She appears to respond positively to repeated movement, so this was issued as part of HEP in addition to grip strengthening. She will benefit from skilled PT to address the above stated deficits in order to optimize her function and assist in overall pain reduction.   OBJECTIVE IMPAIRMENTS: decreased activity tolerance, decreased knowledge of condition, decreased ROM, decreased strength, impaired sensation, impaired UE functional use, postural dysfunction, and pain.   GOALS: Goals reviewed with patient? Yes  SHORT TERM GOALS: Target date: 06/11/2023  Patient will be  independent and compliant with initial HEP.  Baseline: issued at eval Goal status: MET  2.  Patient will report no symptoms distal to the elbow indicative of improvement in her current condition.  Baseline: paraesthesia mostly digits 4-5   Goal status: ONGOING continued parasthesia, tight gripping pain is better in lower arm   3.  Patient will demonstrate at least 25 lbs of Rt grip strength to improve ability to carry items.  Baseline: see above  Goal status: ONGOING  LONG TERM GOALS: Target date: 07/07/23  Patient will score at least 65% on FOTO to signify clinically meaningful improvement in functional abilities.  Baseline: 50 Goal status: INITIAL  2.  Patient will demonstrate functional and pain free cervical AROM to improve ability to complete desk work.  Baseline: see above  Goal status: INITIAL  3.  Patient will demonstrate 5/5 Rt wrist extensor strength to improve ability to lift/carry items.  Baseline: see above  Goal status: INITIAL  4.  Patient will report pain at worst rated as </= 2/10 to reduce current functional limitations.  Baseline: 9 Goal status: INITIAL  PLAN:  PT FREQUENCY: 2x/week  PT DURATION: 6 weeks  PLANNED INTERVENTIONS: 97164- PT Re-evaluation, 97110-Therapeutic exercises, 97530- Therapeutic activity, [id number]- Neuromuscular re-education, 97535- Self Care, 29528- Manual therapy, [id number]- Aquatic Therapy, 97014- Electrical stimulation (unattended), [id number]- Electrical stimulation (manual), [id number]- Traction (mechanical), Taping, Dry Needling, Cryotherapy, and Moist heat  PLAN FOR NEXT SESSION: Review and progress HEP prn;  wrist extensor strengthening. Postural strengthening. Manipulation response. Sustained traction; spinal mobility. ulnar nerve glides, progress to tolerance  Lewanna Petrak, PT 06/22/23 9:36 AM

## 2023-06-25 ENCOUNTER — Ambulatory Visit: Payer: BC Managed Care – PPO

## 2023-06-25 DIAGNOSIS — M5412 Radiculopathy, cervical region: Secondary | ICD-10-CM

## 2023-06-25 DIAGNOSIS — M6281 Muscle weakness (generalized): Secondary | ICD-10-CM

## 2023-06-25 NOTE — Therapy (Signed)
OUTPATIENT PHYSICAL THERAPY CERVICAL TREATMENT   Patient Name: Kristina Kelley MRN: [id number] DOB:12/31/1960, 63 y.o., female Today's Date: 06/25/2023  END OF SESSION:  PT End of Session - 06/25/23 0714     Visit Number 10    Number of Visits 13    Date for PT Re-Evaluation 07/07/23    Authorization Type BCBS    PT Start Time 0715    PT Stop Time 0810    PT Time Calculation (min) 55 min    Activity Tolerance Patient tolerated treatment well    Behavior During Therapy [facility] for tasks assessed/performed              Past Medical History:  Diagnosis Date   Asthma    Hypertension    Thyroid disease    Past Surgical History:  Procedure Laterality Date   ABDOMINAL HYSTERECTOMY     CESAREAN SECTION     THYROID SURGERY     Patient Active Problem List   Diagnosis Date Noted   Asthma 03/21/2022   Gastro-esophageal reflux disease without esophagitis 03/21/2022   Hypertension 03/21/2022   Moderate persistent asthma 03/21/2022   Malignant melanoma of torso excluding breast (HCC) 06/08/2020   Cervical radiculopathy 09/12/2017   Basal cell carcinoma 08/03/2015   Hypothyroidism 08/03/2015   Osteoarthritis of right knee 02/24/2014   Allergic rhinitis 08/25/2011   Hypervitaminosis B6 06/26/2011    PCP: Christen Butter, NP REFERRING PROVIDER: Monica Becton, MD  REFERRING DIAG: M54.12 (ICD-10-CM) - Cervical radiculopathy  THERAPY DIAG:  Radiculopathy, cervical region  Muscle weakness (generalized)  Rationale for Evaluation and Treatment: Rehabilitation  ONSET DATE: 05/12/23  SUBJECTIVE:                                                                                                                                                                                                         SUBJECTIVE STATEMENT: Patient reports a lot of stiffness today. Probably from doing a lot of computer work this weekend. Was really sore after last session. "The tingling isn't as bad and  it's coming back in the elbow."  EVAL: Patient reports pain on the Rt side of her mid/upper back that goes down the back of the Rt arm and to digits 4-5. The tingling typically can start in digits 4-5 and then spreads to the rest of her fingers and hand. She gets a tingling sensation down the arm too and can get a lot of pain in the back of the elbow. Last Saturday she felt a twinge in her mid back when she went to  get in bed and she went to yoga last Monday and that's when the pain began to shoot down the arm. She had had PT before for her neck due to trap pain/tightness >15 years ago. She saw Dr. Karie Schwalbe on Thursday and was given prednisone that gives short-term relief. 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 Standing feels better, but sitting and laying down causes more pain.  Hand dominance: Right  PERTINENT HISTORY:  Thyroid surgery   PAIN:  Are you having pain? Yes: NPRS scale: 7 /10 Pain location: mid/upper back Pain description: "tight" Aggravating factors: sitting, laying, turning and bending neck  Relieving factors: prednisone, holding the arm overhead  PRECAUTIONS: None  WEIGHT BEARING RESTRICTIONS: No  FALLS:  Has patient fallen in last 6 months? No  OCCUPATION: recruiter, desk job  PATIENT GOALS: "I would like you to fix my back and my arm."   NEXT MD VISIT: 06/28/23  OBJECTIVE:  Note: Objective measures were completed at Evaluation unless otherwise noted. DIAGNOSTIC FINDINGS:  X-ray pending   PATIENT SURVEYS:  FOTO 50% function to 65% predicted 06/18/23: 54% function  COGNITION: Overall cognitive status: Within functional limits for tasks assessed  SENSATION: Light touch: WFL  POSTURE: rounded shoulders and forward head  PALPATION: Not assessed    CERVICAL ROM:  Active ROM A/PROM (deg) eval 06/18/23  Flexion 50 pain 60 tight   Extension 43 60  Right lateral flexion 25   Left lateral flexion 25   Right rotation 55 pain  63; 80 post manual    Left rotation 55 55; 65 post manual    (Blank rows = not tested)  UPPER EXTREMITY ROM: Active ROM Right eval Left eval  Shoulder flexion    Shoulder extension    Shoulder abduction    Shoulder adduction    Shoulder extension    Shoulder internal rotation    Shoulder external rotation    Elbow flexion    Elbow extension    Wrist flexion    Wrist extension    Wrist ulnar deviation    Wrist radial deviation    Wrist pronation    Wrist supination     (Blank rows = not tested)  UPPER EXTREMITY MMT: MMT Right eval Left eval Right 06/04/23 R 06/07/23 06/11/23 Right  06/25/23 Right   Shoulder flexion        Shoulder extension        Shoulder abduction 5 5      Shoulder adduction        Shoulder extension        Shoulder internal rotation        Shoulder external rotation        Middle trapezius        Lower trapezius        Elbow flexion 5 5      Elbow extension 5 5      Wrist flexion 5 5      Wrist extension 4 5      Wrist ulnar deviation        Wrist radial deviation        Wrist pronation        Finger abduction  3+ 4+      Grip strength 15 45 20 13 20 20    (Blank rows = not tested)  CERVICAL SPECIAL TESTS:  Spurling's (+) Cervical Distraction (+)  Hoffman's (-)   FUNCTIONAL TESTS:  Not tested  [facility] Adult PT Treatment:  DATE: 06/25/23 Therapeutic Exercise: Forearm extensor stretch x 20 sec Tricep stretch x 20 sec Bilateral shoulder ER red band 2 x 10  Updated HEP  Manual Therapy: Prone mid T-spine CPAs grade V without cavitation noted.  J Thrust cervicothoracic manipulation   IASTM Rt tricep and forearm extensor mass; trigger point release   Modalities: MHP to neck/mid back x 10 minutes    [facility] Adult PT Treatment:                                                DATE: 06/22/2023 Therapeutic Exercise: Prone On elbows trunk rotation opening R and L x 3 reps Scapular protraction/retraction x 5 reps On elbows  with cervical rotation and flexion Standing Yoga egg with scapular retraction  Yoga egg with "W"s  Manual Therapy: Rotational mobilization with movement with lateral glide of mid t-spine Prone thoracic mobilization STM cervical and thoracic paraspinals Trigger Point Dry Needling Subsequent Treatment: Instructions reviewed, if requested by the patient, prior to subsequent dry needling treatment.  Patient Verbal Consent Given: Yes Education Handout Provided: Previously Provided Muscles Treated: bilateral suboccipitals, R cervical paraspinals C6/C7, R T5 paraspinals using bony backdrop Treatment Response/Outcome: palpable lengthening *ended with hot pack  [facility] Adult PT Treatment:                                                DATE: 06/18/23 Therapeutic Exercise: Thoracic extension on 1/2 foam roller x 10  Cow/cow to childs pose x 10  Puppy pose x 30 sec Manual Therapy: Mid/lower T-spine CPAs grade IV-V with cavitation J Thrust cervicothoracic manipulation   STM cervicothoracic paraspinals, rhomboids   Modalities: Mechanical cervical traction sustained x 10 minutes, 2 step progression and regression max 18 lbs, min 8 lbs.     PATIENT EDUCATION:  Education details:HEP update  Person educated: Patient Education method: Explanation, Demonstration, Tactile cues, Verbal cues, and Handouts Education comprehension: verbalized understanding, returned demonstration, verbal cues required, tactile cues required, and needs further education  HOME EXERCISE PROGRAM: Access Code: [email or web address] URL: [email or web address] Date: 06/25/2023 Prepared by: Letitia Libra  Exercises - Seated Cervical Retraction and Extension  - 6 x daily - 7 x weekly - 1 sets - 10 reps - Putty Squeezes  - 2 x daily - 7 x weekly - 1 sets - 10 reps - Wrist Extension with Resistance  - 2 x daily - 7 x weekly - 2 sets - 10 reps - Prone Scapular Slide with Shoulder Extension  - 2 x daily - 7 x weekly - 1 sets -  10 reps - Seated Thoracic Lumbar Extension with Pectoralis Stretch  - 1 x daily - 7 x weekly - 1 sets - 10 reps - Standing Ulnar Nerve Glide  - 1 x daily - 7 x weekly - 1 sets - 10 reps - Mid-Lower Cervical Extension SNAG with Strap  - 1 x daily - 7 x weekly - 1 sets - 10 reps - Sidelying Thoracic Rotation with Open Book  - 1 x daily - 7 x weekly - 1 sets - 10 reps - Standing Wrist Flexion Stretch  - 1 x daily - 7 x weekly - 3 sets - 20 sec  hold - Standing Overhead  Triceps Stretch  - 1 x daily - 7 x weekly - 3 sets - 30 sec  hold - Shoulder External Rotation and Scapular Retraction with Resistance  - 1 x daily - 7 x weekly - 2 sets - 10 reps  ASSESSMENT:  CLINICAL IMPRESSION: Patient responded well to manual work today reporting improvement in cervical/thoracic tightness and Rt elbow pain. She was noted to have trigger points about triceps and forearm extensor mass reporting improvement in digit 4-5 tingling following trigger point release. Updated HEP to include triceps and forearm extensor stretching and postural strengthening.   EVAL: Patient is a 63 y.o. female who was seen today for physical therapy evaluation and treatment for cervical radiculopathy. Her pain began last Saturday when she went to lay down in bed and felt immediate pain in her upper/mid back. She then began experiencing radicular pain about the RUE during yoga last Monday. She has positive radicular special testing and associated wrist extensor, grip and finger abduction weakness. She appears to respond positively to repeated movement, so this was issued as part of HEP in addition to grip strengthening. She will benefit from skilled PT to address the above stated deficits in order to optimize her function and assist in overall pain reduction.   OBJECTIVE IMPAIRMENTS: decreased activity tolerance, decreased knowledge of condition, decreased ROM, decreased strength, impaired sensation, impaired UE functional use, postural  dysfunction, and pain.   GOALS: Goals reviewed with patient? Yes  SHORT TERM GOALS: Target date: 06/11/2023  Patient will be independent and compliant with initial HEP.  Baseline: issued at eval Goal status: MET  2.  Patient will report no symptoms distal to the elbow indicative of improvement in her current condition.  Baseline: paraesthesia mostly digits 4-5   Goal status: ONGOING continued parasthesia, tight gripping pain is better in lower arm   3.  Patient will demonstrate at least 25 lbs of Rt grip strength to improve ability to carry items.  Baseline: see above  Goal status: ONGOING  LONG TERM GOALS: Target date: 07/07/23  Patient will score at least 65% on FOTO to signify clinically meaningful improvement in functional abilities.  Baseline: 50 Goal status: INITIAL  2.  Patient will demonstrate functional and pain free cervical AROM to improve ability to complete desk work.  Baseline: see above  Goal status: INITIAL  3.  Patient will demonstrate 5/5 Rt wrist extensor strength to improve ability to lift/carry items.  Baseline: see above  Goal status: INITIAL  4.  Patient will report pain at worst rated as </= 2/10 to reduce current functional limitations.  Baseline: 9 Goal status: INITIAL  PLAN:  PT FREQUENCY: 2x/week  PT DURATION: 6 weeks  PLANNED INTERVENTIONS: 97164- PT Re-evaluation, 97110-Therapeutic exercises, 97530- Therapeutic activity, [id number]- Neuromuscular re-education, 97535- Self Care, 16109- Manual therapy, [id number]- Aquatic Therapy, 97014- Electrical stimulation (unattended), [id number]- Electrical stimulation (manual), [id number]- Traction (mechanical), Taping, Dry Needling, Cryotherapy, and Moist heat  PLAN FOR NEXT SESSION: Review and progress HEP prn;  wrist extensor strengthening. Postural strengthening. Manipulation response. Sustained traction; spinal mobility. ulnar nerve glides, progress to tolerance  Letitia Libra, PT, DPT, ATC 06/25/23 9:54 AM

## 2023-06-28 ENCOUNTER — Ambulatory Visit: Payer: BC Managed Care – PPO

## 2023-06-28 ENCOUNTER — Ambulatory Visit: Payer: BC Managed Care – PPO | Admitting: Sports Medicine

## 2023-06-28 DIAGNOSIS — M5412 Radiculopathy, cervical region: Secondary | ICD-10-CM | POA: Diagnosis not present

## 2023-06-28 DIAGNOSIS — M6281 Muscle weakness (generalized): Secondary | ICD-10-CM

## 2023-06-28 DIAGNOSIS — M542 Cervicalgia: Secondary | ICD-10-CM

## 2023-06-28 MED ORDER — GABAPENTIN 300 MG PO CAPS: ORAL_CAPSULE | ORAL | 3 refills | Status: DC

## 2023-06-28 NOTE — Therapy (Signed)
OUTPATIENT PHYSICAL THERAPY CERVICAL TREATMENT  PHYSICAL THERAPY DISCHARGE SUMMARY  Visits from Start of Care: 11  Current functional level related to goals / functional outcomes: See goals below   Remaining deficits: Grip/intrinsic Rt hand weakness Paraesthesia digits 4-5 Lower cervical/upper thoracic and Rt elbow pain    Education / Equipment: See education below    Patient agrees to discharge. Patient goals were partially met. Patient is being discharged due to did not respond to therapy.   Patient Name: Kristina Kelley MRN: [id number] DOB:[birth year], 63 y.o., female Today's Date: 06/28/2023  END OF SESSION:  PT End of Session - 06/28/23 0801     Visit Number 11    Number of Visits 13    Date for PT Re-Evaluation 07/07/23    Authorization Type BCBS    PT Start Time 0801    PT Stop Time 0840    PT Time Calculation (min) 39 min    Activity Tolerance Patient tolerated treatment well    Behavior During Therapy [facility] for tasks assessed/performed               Past Medical History:  Diagnosis Date   Asthma    Hypertension    Thyroid disease    Past Surgical History:  Procedure Laterality Date   ABDOMINAL HYSTERECTOMY     CESAREAN SECTION     THYROID SURGERY     Patient Active Problem List   Diagnosis Date Noted   Asthma 03/21/2022   Gastro-esophageal reflux disease without esophagitis 03/21/2022   Hypertension 03/21/2022   Moderate persistent asthma 03/21/2022   Malignant melanoma of torso excluding breast (HCC) 06/08/2020   Cervical radiculopathy 09/12/2017   Basal cell carcinoma 08/03/2015   Hypothyroidism 08/03/2015   Osteoarthritis of right knee 02/24/2014   Allergic rhinitis 08/25/2011   Hypervitaminosis B6 06/26/2011    PCP: Christen Butter, NP REFERRING PROVIDER: Monica Becton, MD  REFERRING DIAG: M54.12 (ICD-10-CM) - Cervical radiculopathy  THERAPY DIAG:  Radiculopathy, cervical region  Muscle weakness (generalized)  Rationale for  Evaluation and Treatment: Rehabilitation  ONSET DATE: 05/12/23  SUBJECTIVE:                                                                                                                                                                                                         SUBJECTIVE STATEMENT: Patient reports tingling in digits 4-5 and pain in the elbow and Rt mid back (between medial scapular border and T-spine). She feels she has experienced short term relief with PT, but nothing has created lasting pain and the tingling  has remained fairly constant, though not as intense as initial onset.   EVAL: Patient reports pain on the Rt side of her mid/upper back that goes down the back of the Rt arm and to digits 4-5. The tingling typically can start in digits 4-5 and then spreads to the rest of her fingers and hand. She gets a tingling sensation down the arm too and can get a lot of pain in the back of the elbow. Last Saturday she felt a twinge in her mid back when she went to get in bed and she went to yoga last Monday and that's when the pain began to shoot down the arm. She had had PT before for her neck due to trap pain/tightness >15 years ago. She saw Dr. Karie Schwalbe on Thursday and was given prednisone that gives short-term relief. 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 Standing feels better, but sitting and laying down causes more pain.  Hand dominance: Right  PERTINENT HISTORY:  Thyroid surgery   PAIN:  Are you having pain? Yes: NPRS scale: 6/10 Pain location: mid/upper back; Rt elbow  Pain description: shooting,dull, ache  Aggravating factors: sitting, laying, turning and bending neck  Relieving factors: prednisone, holding the arm overhead  PRECAUTIONS: None  WEIGHT BEARING RESTRICTIONS: No  FALLS:  Has patient fallen in last 6 months? No  OCCUPATION: recruiter, desk job  PATIENT GOALS: "I would like you to fix my back and my arm."   NEXT MD VISIT: 06/28/23  OBJECTIVE:   Note: Objective measures were completed at Evaluation unless otherwise noted. DIAGNOSTIC FINDINGS:  X-ray pending   PATIENT SURVEYS:  FOTO 50% function to 65% predicted 06/18/23: 54% function  06/28/23: 50% function  COGNITION: Overall cognitive status: Within functional limits for tasks assessed  SENSATION: Light touch: WFL  POSTURE: rounded shoulders and forward head  PALPATION: Not assessed    CERVICAL ROM:  Active ROM A/PROM (deg) eval 06/18/23 06/28/23  Flexion 50 pain 60 tight  61 tight   Extension 43 60 74  Right lateral flexion 25  30  Left lateral flexion 25  25 increased tingling in digits 4-5  Right rotation 55 pain  63; 80 post manual  75  Left rotation 55 55; 65 post manual  64   (Blank rows = not tested)  UPPER EXTREMITY ROM: Active ROM Right eval Left eval  Shoulder flexion    Shoulder extension    Shoulder abduction    Shoulder adduction    Shoulder extension    Shoulder internal rotation    Shoulder external rotation    Elbow flexion    Elbow extension    Wrist flexion    Wrist extension    Wrist ulnar deviation    Wrist radial deviation    Wrist pronation    Wrist supination     (Blank rows = not tested)  UPPER EXTREMITY MMT: MMT Right eval Left eval Right 06/04/23 R 06/07/23 06/11/23 Right  06/25/23 Right  06/28/23 Right   Shoulder flexion         Shoulder extension         Shoulder abduction 5 5     5   Shoulder adduction         Shoulder extension         Shoulder internal rotation         Shoulder external rotation         Middle trapezius  Lower trapezius         Elbow flexion 5 5     5   Elbow extension 5 5     5   Wrist flexion 5 5     5   Wrist extension 4 5     5   Wrist ulnar deviation         Wrist radial deviation         Wrist pronation         Finger abduction  3+ 4+       Grip strength 15 45 20 13 20 20 20    (Blank rows = not tested)  CERVICAL SPECIAL TESTS:  Spurling's (+) Cervical Distraction (+)   Hoffman's (-)   FUNCTIONAL TESTS:  Not tested  [facility] Adult PT Treatment:                                                DATE: 06/28/23 Therapeutic Exercise: Reviewed and performed advanced HEP that was issued today.    Therapeutic Activity: Re-assessment to determine overall progress, educating patient on progress towards goals.   Self Care: Posture education with handout provided.   [facility] Adult PT Treatment:                                                DATE: 06/25/23 Therapeutic Exercise: Forearm extensor stretch x 20 sec Tricep stretch x 20 sec Bilateral shoulder ER red band 2 x 10  Updated HEP  Manual Therapy: Prone mid T-spine CPAs grade V without cavitation noted.  J Thrust cervicothoracic manipulation   IASTM Rt tricep and forearm extensor mass; trigger point release   Modalities: MHP to neck/mid back x 10 minutes    [facility] Adult PT Treatment:                                                DATE: 06/22/2023 Therapeutic Exercise: Prone On elbows trunk rotation opening R and L x 3 reps Scapular protraction/retraction x 5 reps On elbows with cervical rotation and flexion Standing Yoga egg with scapular retraction  Yoga egg with "W"s  Manual Therapy: Rotational mobilization with movement with lateral glide of mid t-spine Prone thoracic mobilization STM cervical and thoracic paraspinals Trigger Point Dry Needling Subsequent Treatment: Instructions reviewed, if requested by the patient, prior to subsequent dry needling treatment.  Patient Verbal Consent Given: Yes Education Handout Provided: Previously Provided Muscles Treated: bilateral suboccipitals, R cervical paraspinals C6/C7, R T5 paraspinals using bony backdrop Treatment Response/Outcome: palpable lengthening *ended with hot pack  [facility] Adult PT Treatment:                                                DATE: 06/18/23 Therapeutic Exercise: Thoracic extension on 1/2 foam roller x 10  Cow/cow to childs pose x 10   Puppy pose x 30 sec Manual Therapy: Mid/lower T-spine CPAs grade IV-V with cavitation J Thrust cervicothoracic manipulation   STM cervicothoracic paraspinals,  rhomboids   Modalities: Mechanical cervical traction sustained x 10 minutes, 2 step progression and regression max 18 lbs, min 8 lbs.     PATIENT EDUCATION:  Education details:HEP update  Person educated: Patient Education method: Explanation, Demonstration, Tactile cues, Verbal cues, and Handouts Education comprehension: verbalized understanding, returned demonstration, verbal cues required, tactile cues required, and needs further education  HOME EXERCISE PROGRAM: Access Code: [email or web address] URL: [email or web address] Date: 06/28/2023 Prepared by: Letitia Libra  Exercises - Seated Cervical Retraction and Extension  - 6 x daily - 7 x weekly - 1 sets - 10 reps - Wrist Extension with Resistance  - 1 x daily - 7 x weekly - 2 sets - 10 reps - Prone Scapular Slide with Shoulder Extension  - 2 x daily - 7 x weekly - 1 sets - 10 reps - Seated Thoracic Lumbar Extension with Pectoralis Stretch  - 1 x daily - 7 x weekly - 1 sets - 10 reps - Standing Ulnar Nerve Glide  - 1 x daily - 7 x weekly - 1 sets - 10 reps - Sidelying Thoracic Rotation with Open Book  - 1 x daily - 7 x weekly - 1 sets - 10 reps - Standing Wrist Flexion Stretch  - 1 x daily - 7 x weekly - 3 sets - 20 sec  hold - Standing Overhead Triceps Stretch  - 1 x daily - 7 x weekly - 3 sets - 30 sec  hold - Shoulder External Rotation and Scapular Retraction with Resistance  - 1 x daily - 7 x weekly - 2 sets - 10 reps - Putty Squeezes  - 1 x daily - 7 x weekly - 2 sets - 10 reps - Tip Pinch with Putty  - 1 x daily - 7 x weekly - 2 sets - 10 reps - Seated Finger Abduction with Putty  - 1 x daily - 7 x weekly - 2 sets - 10 reps - Finger Lumbricals with Putty  - 1 x daily - 7 x weekly - 2 sets - 10 reps  ASSESSMENT:  CLINICAL IMPRESSION: Alisande has completed 11 PT  visits reporting ongoing upper back/RUE pain and parasthesia about digits 4-5. She has experienced short term pain reduction from various treatment techniques including cervical traction, trigger point dry needling, spinal mobilization/manipulation, and therepeutic exercises aimed at postural strengthing and spinal mobility. She demonstrates overall improvements in cervical AROM (tingling worsened with Lt lateral flexion) and improved wrist strength. She continues to exhibit grip/hand intrinsic weakness. Given her ongoing pain and radicular symptoms she is appropriate for discharge with recommendation to f/u with referring provider for further assessment and treatment of her pain. She is therefore appropriate for discharge with patient in agreement with this plan.   EVAL: Patient is a 63 y.o. female who was seen today for physical therapy evaluation and treatment for cervical radiculopathy. Her pain began last Saturday when she went to lay down in bed and felt immediate pain in her upper/mid back. She then began experiencing radicular pain about the RUE during yoga last Monday. She has positive radicular special testing and associated wrist extensor, grip and finger abduction weakness. She appears to respond positively to repeated movement, so this was issued as part of HEP in addition to grip strengthening. She will benefit from skilled PT to address the above stated deficits in order to optimize her function and assist in overall pain reduction.   OBJECTIVE IMPAIRMENTS: decreased activity tolerance, decreased knowledge of condition, decreased ROM, decreased  strength, impaired sensation, impaired UE functional use, postural dysfunction, and pain.   GOALS: Goals reviewed with patient? Yes  SHORT TERM GOALS: Target date: 06/11/2023  Patient will be independent and compliant with initial HEP.  Baseline: issued at eval Goal status: MET  2.  Patient will report no symptoms distal to the elbow indicative of  improvement in her current condition.  Baseline: paraesthesia mostly digits 4-5   Goal status: NOT MET continued parasthesia, tight gripping pain is better in lower arm   3.  Patient will demonstrate at least 25 lbs of Rt grip strength to improve ability to carry items.  Baseline: see above  Goal status: NOT MET  LONG TERM GOALS: Target date: 07/07/23  Patient will score at least 65% on FOTO to signify clinically meaningful improvement in functional abilities.  Baseline: 50 Goal status: NOT MET  2.  Patient will demonstrate functional and pain free cervical AROM to improve ability to complete desk work.  Baseline: see above  Goal status: PARTIALLY MET  3.  Patient will demonstrate 5/5 Rt wrist extensor strength to improve ability to lift/carry items.  Baseline: see above  Goal status: MET  4.  Patient will report pain at worst rated as </= 2/10 to reduce current functional limitations.  Baseline: 9 Goal status: NOT MET  PLAN:  PT FREQUENCY: 2x/week  PT DURATION: 6 weeks  PLANNED INTERVENTIONS: 97164- PT Re-evaluation, 97110-Therapeutic exercises, 97530- Therapeutic activity, [id number]- Neuromuscular re-education, 97535- Self Care, 84696- Manual therapy, [id number]- Aquatic Therapy, 97014- Electrical stimulation (unattended), [id number]- Electrical stimulation (manual), [id number]- Traction (mechanical), Taping, Dry Needling, Cryotherapy, and Moist heat  PLAN FOR NEXT SESSION: N/A D/C   Letitia Libra, PT, DPT, ATC 06/28/23 12:48 PM

## 2023-06-28 NOTE — Patient Instructions (Signed)
Posture Tips DO: - stand tall and erect - keep chin tucked in - keep head and shoulders in alignment - check posture regularly in mirror or large window - pull head back against headrest in car seat;  Change your position often.  Sit with lumbar support. DON'T: - slouch or slump while watching TV or reading - sit, stand or lie in one position  for too long;  Sitting is especially hard on the spine so if you sit at a desk/use the computer, then stand up often!   Copyright  VHI. All rights reserved.  Posture - Standing   Good posture is important. Avoid slouching and forward head thrust. Maintain curve in low back and align ears over shoul- ders, hips over ankles.  Pull your belly button in toward your back bone.   Copyright  VHI. All rights reserved.  Posture - Sitting   Sit upright, head facing forward. Try using a roll to support lower back. Keep shoulders relaxed, and avoid rounded back. Keep hips level with knees. Avoid crossing legs for long periods.   Copyright  VHI. All rights reserved.    

## 2023-06-28 NOTE — Assessment & Plan Note (Signed)
Pleasant 63 year old female, multilevel cervical DDD, predominately left-sided radicular symptoms. She has done some physical therapy, steroid burst, a 12-day prednisone taper, muscle relaxers, unfortunately continues to have symptoms. Adding gabapentin. We will also order a cervical spine MRI for interventional planning, we will follow-up with her before ordering the epidural. Of note she is taking naltrexone prescribed by a functional medicine clinic, I have asked her to stop it before starting gabapentin to see if her pain changes.

## 2023-06-28 NOTE — Progress Notes (Signed)
    Procedures performed today:    None.  Independent interpretation of notes and tests performed by another provider:   None.  Brief History, Exam, Impression, and Recommendations:    Cervical radiculopathy Pleasant 63 year old female, multilevel cervical DDD, predominately left-sided radicular symptoms. She has done some physical therapy, steroid burst, a 12-day prednisone taper, muscle relaxers, unfortunately continues to have symptoms. Adding gabapentin. We will also order a cervical spine MRI for interventional planning, we will follow-up with her before ordering the epidural. Of note she is taking naltrexone prescribed by a functional medicine clinic, I have asked her to stop it before starting gabapentin to see if her pain changes.    ____________________________________________ Ihor Austin. Benjamin Stain, M.D., ABFM., CAQSM., AME. Primary Care and Sports Medicine [facility]  Adjunct Professor of Family Medicine  [place] [employer] [facility] [employer] [facility] [place] [employer]  Restaurant manager, fast food

## 2023-07-09 ENCOUNTER — Encounter: Payer: Self-pay | Admitting: Sports Medicine

## 2023-07-13 ENCOUNTER — Ambulatory Visit: Payer: BC Managed Care – PPO | Admitting: Medical-Surgical

## 2023-07-17 ENCOUNTER — Other Ambulatory Visit: Payer: Self-pay

## 2023-07-21 ENCOUNTER — Ambulatory Visit: Payer: BC Managed Care – PPO

## 2023-07-21 DIAGNOSIS — M4802 Spinal stenosis, cervical region: Secondary | ICD-10-CM

## 2023-07-21 DIAGNOSIS — M4722 Other spondylosis with radiculopathy, cervical region: Secondary | ICD-10-CM | POA: Diagnosis not present

## 2023-07-21 DIAGNOSIS — M542 Cervicalgia: Secondary | ICD-10-CM

## 2023-07-21 DIAGNOSIS — M5412 Radiculopathy, cervical region: Secondary | ICD-10-CM

## 2023-09-04 ENCOUNTER — Other Ambulatory Visit: Payer: Self-pay | Admitting: *Deleted

## 2023-09-04 DIAGNOSIS — Z1231 Encounter for screening mammogram for malignant neoplasm of breast: Secondary | ICD-10-CM

## 2023-10-04 ENCOUNTER — Ambulatory Visit

## 2023-10-04 DIAGNOSIS — Z1231 Encounter for screening mammogram for malignant neoplasm of breast: Secondary | ICD-10-CM

## 2023-10-09 ENCOUNTER — Other Ambulatory Visit: Payer: Self-pay | Admitting: *Deleted

## 2023-10-09 DIAGNOSIS — R928 Other abnormal and inconclusive findings on diagnostic imaging of breast: Secondary | ICD-10-CM

## 2023-10-11 ENCOUNTER — Encounter (HOSPITAL_COMMUNITY): Payer: Self-pay

## 2023-10-18 ENCOUNTER — Ambulatory Visit
Admission: RE | Admit: 2023-10-18 | Discharge: 2023-10-18 | Disposition: A | Discharge status: Home / Self Care | Source: Ambulatory Visit | Attending: *Deleted | Admitting: *Deleted

## 2023-10-18 ENCOUNTER — Ambulatory Visit
Admission: RE | Admit: 2023-10-18 | Discharge: 2023-10-18 | Disposition: A | Discharge status: Home / Self Care | Source: Ambulatory Visit | Attending: *Deleted

## 2023-10-18 ENCOUNTER — Other Ambulatory Visit: Payer: Self-pay | Admitting: *Deleted

## 2023-10-18 DIAGNOSIS — N6489 Other specified disorders of breast: Secondary | ICD-10-CM

## 2023-10-18 DIAGNOSIS — R928 Other abnormal and inconclusive findings on diagnostic imaging of breast: Secondary | ICD-10-CM

## 2023-11-11 ENCOUNTER — Ambulatory Visit
Admission: RE | Admit: 2023-11-11 | Discharge: 2023-11-11 | Disposition: A | Discharge status: Home / Self Care | Source: Ambulatory Visit | Attending: Family Medicine | Admitting: Family Medicine

## 2023-11-11 ENCOUNTER — Other Ambulatory Visit: Payer: Self-pay

## 2023-11-11 VITALS — BP 134/84 | HR 77 | Temp 98.1°F | Resp 16

## 2023-11-11 DIAGNOSIS — H5789 Other specified disorders of eye and adnexa: Secondary | ICD-10-CM

## 2023-11-11 MED ORDER — DICLOFENAC SODIUM 0.1 % OP SOLN: 1.0000 [drp] | Freq: Four times a day (QID) | OPHTHALMIC | 0 refills | Status: AC

## 2023-11-11 MED ORDER — MOXIFLOXACIN HCL 0.5 % OP SOLN: 1.0000 [drp] | Freq: Three times a day (TID) | OPHTHALMIC | 0 refills | Status: AC

## 2023-11-11 NOTE — ED Triage Notes (Addendum)
 Friday night felt like contact had tear in it, took it out Saturday morning. Feels like something is in right eye. Eye has been watering and burning. No visual changes. No otc meds.

## 2023-11-11 NOTE — ED Provider Notes (Signed)
 [facility]    CSN: [id number] Arrival date & time: 11/11/23  0818      History   Chief Complaint Chief Complaint  Patient presents with   Eye Problem    HPI Kristina Kelley is a 63 y.o. female.   Patient's contact lens wearer.  She started having irritation in her right eye on Friday evening.  It was minor.  She went to bed Friday and woke up Saturday with increased irritation.  She took out her contact lens Saturday, with difficulty.  She has a foreign body sensation in the lower portion of her eye.  Vision is intact.  Eye feels irritated.  Had slight crusting when she woke up this morning.    Past Medical History:  Diagnosis Date   Asthma    Hypertension    Thyroid  disease     Patient Active Problem List   Diagnosis Date Noted   Asthma 03/21/2022   Gastro-esophageal reflux disease without esophagitis 03/21/2022   Hypertension 03/21/2022   Moderate persistent asthma 03/21/2022   Malignant melanoma of torso excluding breast (HCC) 06/08/2020   Cervical radiculopathy 09/12/2017   Basal cell carcinoma 08/03/2015   Hypothyroidism 08/03/2015   Osteoarthritis of right knee 02/24/2014   Allergic rhinitis 08/25/2011   Hypervitaminosis B6 06/26/2011    Past Surgical History:  Procedure Laterality Date   ABDOMINAL HYSTERECTOMY     CESAREAN SECTION     THYROID  SURGERY      OB History   No obstetric history on file.      Home Medications    Prior to Admission medications   Medication Sig Start Date End Date Taking? Authorizing Provider  diclofenac (VOLTAREN) 0.1 % ophthalmic solution Place 1 drop into the right eye 4 (four) times daily. 11/11/23  Yes Stephany Ehrich, MD  moxifloxacin (VIGAMOX) 0.5 % ophthalmic solution Place 1 drop into the right eye 3 (three) times daily. 11/11/23  Yes Stephany Ehrich, MD  albuterol (PROVENTIL HFA;VENTOLIN HFA) 108 (90 BASE) MCG/ACT inhaler Inhale into the lungs every 6 (six) hours as needed for wheezing or shortness of  breath.    [provider]  budesonide-formoterol (SYMBICORT) 160-4.5 MCG/ACT inhaler Inhale 2 puffs into the lungs 2 (two) times daily.    [provider]  Cholecalciferol (D3 5000 PO) Take 5,000 Units by mouth daily.    [provider]  cyanocobalamin (VITAMIN B12) 1000 MCG tablet Take 1,000 mcg by mouth daily.    [provider]  estradiol  (VIVELLE -DOT) 0.075 MG/24HR Apply 1 new patch twice weekly as directed. 09/27/21     fexofenadine (ALLEGRA) 180 MG tablet Take 180 mg by mouth daily.    [provider]  hydrochlorothiazide (HYDRODIURIL) 25 MG tablet Take 25 mg by mouth daily.    [provider]  levothyroxine (SYNTHROID) 100 MCG tablet Take 100 mcg by mouth every morning. 12/28/20   [provider]  Lysine 500 MG CAPS Take 500 mg by mouth daily.    [provider]    Family History Family History  Problem Relation Age of Onset   Hypertension Other    Diabetes Other    Lung cancer Other    ALS Other    Breast cancer Sister 54    Social History Social History   Tobacco Use   Smoking status: Former   Smokeless tobacco: Never  Vaping Use   Vaping status: Never Used  Substance Use Topics   Alcohol use: No   Drug use: No  Allergies   Penicillins   Review of Systems Review of Systems See HPI  Physical Exam Triage Vital Signs ED Triage Vitals [11/11/23 0826]  Encounter Vitals Group     BP 134/84     Systolic BP Percentile      Diastolic BP Percentile      Pulse Rate 77     Resp 16     Temp 98.1 F (36.7 C)     Temp src      SpO2 97 %     Weight      Height      Head Circumference      Peak Flow      Pain Score 9     Pain Loc      Pain Education      Exclude from Growth Chart    No data found.  Updated Vital Signs BP 134/84   Pulse 77   Temp 98.1 F (36.7 C)   Resp 16   SpO2 97%      Physical Exam Constitutional:      General: She is not in acute distress.     Appearance: She is well-developed.     Comments: Eyes look normal on visual inspection  HENT:     Head: Normocephalic and atraumatic.  Eyes:     General: Lids are normal. Lids are everted, no foreign bodies appreciated. Vision grossly intact. Gaze aligned appropriately.        Right eye: No foreign body or discharge.     Conjunctiva/sclera: Conjunctivae normal.     Pupils: Pupils are equal, round, and reactive to light.     Comments: On fluorescein exam no defect is seen.  No foreign body identified.  There is slight conjunctival irritation under the lower lid.  Eye irrigated.  Cardiovascular:     Rate and Rhythm: Normal rate.  Pulmonary:     Effort: Pulmonary effort is normal. No respiratory distress.  Abdominal:     General: There is no distension.     Palpations: Abdomen is soft.  Musculoskeletal:        General: Normal range of motion.     Cervical back: Normal range of motion.  Skin:    General: Skin is warm and dry.  Neurological:     Mental Status: She is alert.      UC Treatments / Results  Labs (all labs ordered are listed, but only abnormal results are displayed) Labs Reviewed - No data to display  EKG   Radiology No results found.  Procedures Procedures (including critical care time)  Medications Ordered in UC Medications - No data to display  Initial Impression / Assessment and Plan / UC Course  I have reviewed the triage vital signs and the nursing notes.  Pertinent labs & imaging results that were available during my care of the patient were reviewed by me and considered in my medical decision making (see chart for details).     Final Clinical Impressions(s) / UC Diagnoses   Final diagnoses:  Eye irritation   Discharge Instructions      Use the antibiotic eyedrop 3 times a day The Voltaren eyedrops for pain.  Use 4 times a day See your eye doctor if not improving in 48 hours  ED Prescriptions     Medication Sig Dispense Auth. Provider    moxifloxacin (VIGAMOX) 0.5 % ophthalmic solution Place 1 drop into the right eye 3 (three) times daily. 3 mL Stephany Ehrich, MD  diclofenac (VOLTAREN) 0.1 % ophthalmic solution Place 1 drop into the right eye 4 (four) times daily. 5 mL Stephany Ehrich, MD      PDMP not reviewed this encounter.   Stephany Ehrich, MD 11/11/23 [phone]

## 2023-11-11 NOTE — Discharge Instructions (Signed)
 Use the antibiotic eyedrop 3 times a day The Voltaren eyedrops for pain.  Use 4 times a day See your eye doctor if not improving in 48 hours

## 2024-02-05 ENCOUNTER — Encounter: Payer: Self-pay | Admitting: Sports Medicine

## 2024-03-18 ENCOUNTER — Other Ambulatory Visit: Payer: Self-pay | Admitting: *Deleted

## 2024-03-18 DIAGNOSIS — N6489 Other specified disorders of breast: Secondary | ICD-10-CM

## 2024-03-18 DIAGNOSIS — R928 Other abnormal and inconclusive findings on diagnostic imaging of breast: Secondary | ICD-10-CM

## 2024-04-21 ENCOUNTER — Ambulatory Visit
Admission: RE | Admit: 2024-04-21 | Discharge: 2024-04-21 | Disposition: A | Source: Ambulatory Visit | Attending: *Deleted

## 2024-04-21 ENCOUNTER — Ambulatory Visit

## 2024-04-21 ENCOUNTER — Encounter

## 2024-04-21 DIAGNOSIS — N6489 Other specified disorders of breast: Secondary | ICD-10-CM

## 2024-04-21 DIAGNOSIS — R928 Other abnormal and inconclusive findings on diagnostic imaging of breast: Secondary | ICD-10-CM
